# Patient Record
Sex: Male | Born: 1995
Health system: Southern US, Community
[De-identification: ages and names within clinical notes are randomized; demographics above are authoritative.]

## PROBLEM LIST (undated history)

## (undated) DIAGNOSIS — Z7189 Other specified counseling: Secondary | ICD-10-CM

## (undated) HISTORY — PX: FRACTURE SURGERY: SHX138

---
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
## 2017-06-08 ENCOUNTER — Encounter: Payer: Self-pay | Admitting: Family Medicine

## 2017-06-08 ENCOUNTER — Ambulatory Visit (INDEPENDENT_AMBULATORY_CARE_PROVIDER_SITE_OTHER): Payer: BLUE CROSS/BLUE SHIELD | Admitting: Family Medicine

## 2017-06-08 VITALS — BP 126/61 | HR 77 | Temp 98.2°F | Resp 16 | Ht 71.0 in | Wt 267.0 lb

## 2017-06-08 DIAGNOSIS — Z0000 Encounter for general adult medical examination without abnormal findings: Secondary | ICD-10-CM | POA: Diagnosis not present

## 2017-06-08 DIAGNOSIS — L738 Other specified follicular disorders: Secondary | ICD-10-CM

## 2017-06-08 DIAGNOSIS — Z113 Encounter for screening for infections with a predominantly sexual mode of transmission: Secondary | ICD-10-CM | POA: Diagnosis not present

## 2017-06-08 DIAGNOSIS — R011 Cardiac murmur, unspecified: Secondary | ICD-10-CM

## 2017-06-08 DIAGNOSIS — Z8249 Family history of ischemic heart disease and other diseases of the circulatory system: Secondary | ICD-10-CM | POA: Diagnosis not present

## 2017-06-08 MED ORDER — TRIAMCINOLONE ACETONIDE 0.1 % EX CREA: 1.0000 "application " | TOPICAL_CREAM | Freq: Two times a day (BID) | CUTANEOUS | 0 refills | Status: DC

## 2017-06-08 NOTE — Patient Instructions (Addendum)
See information below and try the steroid cream up to twice per day for bumps on scalp. I will refer you to dermatology.   Condoms every time.   I will order echo for your heart murmur. Once that is completed, will likely be able to complete sports paperwork. I will hold onto that paperwork for now.    Keeping you healthy  Get these tests  Blood pressure- Have your blood pressure checked once a year by your healthcare provider.  Normal blood pressure is 120/80.  Weight- Have your body mass index (BMI) calculated to screen for obesity.  BMI is a measure of body fat based on height and weight. You can also calculate your own BMI at [email or web address]-[email or web address]/.  Cholesterol- Have your cholesterol checked regularly starting at age 19, sooner may be necessary if you have diabetes, high blood pressure, if a family member developed heart diseases at an early age or if you smoke.   Chlamydia, HIV, and other sexual transmitted disease- Get screened each year until the age of 19 then within three months of each new sexual partner.  Diabetes- Have your blood sugar checked regularly if you have high blood pressure, high cholesterol, a family history of diabetes or if you are overweight.  Get these vaccines  Flu shot- Every fall.  Tetanus shot- Every 10 years.  Menactra- Single dose; prevents meningitis.  Take these steps  Don't smoke- If you do smoke, ask your healthcare provider about quitting. For tips on how to quit, go to www.smokefree.gov or call 1-800-QUIT-NOW.  Be physically active- Exercise 5 days a week for at least 30 minutes.  If you are not already physically active start slow and gradually work up to 30 minutes of moderate physical activity.  Examples of moderate activity include walking briskly, mowing the yard, dancing, swimming bicycling, etc.  Eat a healthy diet- Eat a variety of healthy foods such as fruits, vegetables, low fat milk, low fat cheese, yogurt, lean meats, poultry,  fish, beans, tofu, etc.  For more information on healthy eating, go to www.thenutritionsource.org  Drink alcohol in moderation- Limit alcohol intake two drinks or less a day.  Never drink and drive.  Dentist- Brush and floss teeth twice daily; visit your dentis twice a year.  Depression-Your emotional health is as important as your physical health.  If you're feeling down, losing interest in things you normally enjoy please talk with your healthcare provider.  Gun Safety- If you keep a gun in your home, keep it unloaded and with the safety lock on.  Bullets should be stored separately.  Helmet use- Always wear a helmet when riding a motorcycle, bicycle, rollerblading or skateboarding.  Safe sex- If you may be exposed to a sexually transmitted infection, use a condom  Seat belts- Seat bels can save your life; always wear one.  Smoke/Carbon Monoxide detectors- These detectors need to be installed on the appropriate level of your home.  Replace batteries at least once a year.  Skin Cancer- When out in the sun, cover up and use sunscreen SPF 15 or higher.  Violence- If anyone is threatening or hurting you, please tell your healthcare provider.  Heart Murmur A heart murmur is an extra sound that is caused by chaotic blood flow. The murmur can be heard as a "hum" or "whoosh" sound when blood flows through the heart. The heart has four areas called chambers. Valves separate the upper and lower chambers from each other (tricuspid valve and mitral valve) and separate the  lower chambers of the heart from pathways that lead away from the heart (aortic valve and pulmonary valve). Normally, the valves open to let blood flow through or out of your heart, and then they shut to keep the blood from flowing backward. There are two types of heart murmurs:  Innocent murmurs. Most people with this type of heart murmur do not have a heart problem. Many children have innocent heart murmurs. Your health care  provider may suggest some basic testing to find out whether your murmur is an innocent murmur. If an innocent heart murmur is found, there is no need for further tests or treatment and no need to restrict activities or stop playing sports.  Abnormal murmurs. These types of murmurs can occur in children and adults. Abnormal murmurs may be a sign of a more serious heart condition, such as a heart defect present at birth (congenital defect) or heart valve disease.  What are the causes? This condition is caused by heart valves that are not working properly. In children, abnormal heart murmurs are typically caused by congenital defects. In adults, abnormal murmurs are usually from heart valve problems caused by disease, infection, or aging. Three types of heart valve defects can cause a murmur:  Regurgitation. This is when blood leaks back through the valve in the wrong direction.  Mitral valve prolapse. This is when the mitral valve of the heart has a loose flap and does not close tightly.  Stenosis. This is when a valve does not open enough and blocks blood flow.  This condition may also be caused by:  Pregnancy.  Fever.  Overactive thyroid gland.  Anemia.  Exercise.  Rapid growth spurts (in children).  What are the signs or symptoms? Innocent murmurs do not cause symptoms, and many people with abnormal murmurs may or may not have symptoms. If symptoms do develop, they may include:  Shortness of breath.  Blue coloring of the skin, especially on the fingertips.  Chest pain.  Palpitations, or feeling a fluttering or skipped heartbeat.  Fainting.  Persistent cough.  Getting tired much faster than expected.  Swelling in the abdomen, feet, or ankles.  How is this diagnosed? This condition may be diagnosed during a routine physical or other exam. If your health care provider hears a murmur with a stethoscope, he or she will listen for:  Where the murmur is located in your  heart.  How long the murmur lasts (duration).  When the murmur is heard during the heartbeat.  How loud the murmur is. This may help the health care provider figure out what is causing the murmur.  You may be referred to a heart specialist (cardiologist). You may also have other tests, including:  Electrocardiogram (ECG or EKG). This test measures the electrical activity of your heart.  Echocardiogram. This test uses high frequency sound waves to make pictures of your heart.  MRI or chest X-ray.  Cardiac catheterization. This test looks at blood flow through the heart.  For children and adults who have an abnormal heart murmur and want to stay active, it is important to complete testing, review test results, and receive recommendations from your health care provider. If heart disease is present, it may not be safe to play or be active. How is this treated? Heart murmurs themselves do not need treatment. In some cases, a heart murmur may go away on its own. If an underlying problem or disease is causing the murmur, you may need treatment. If treatment is needed,  it will depend on the type and severity of the disease or heart problem causing the murmur. Treatment may include:  Medicine.  Surgery.  Dietary and lifestyle changes.  Follow these instructions at home:  Talk with your health care provider before participating in sports or other activities that require a lot of effort and energy (are strenuous).  Learn as much as possible about your condition and any related diseases. Ask your health care provider if you may at risk for any medical emergencies.  Talk with your health care provider about what symptoms you should look out for.  It is up to you to get your test results. Ask your health care provider, or the department that is doing the test, when your results will be ready.  Keep all follow-up visits as told by your health care provider. This is important. Contact a health  care provider if:  You feel light-headed.  You are frequently short of breath.  You feel more tired than usual.  You are having a hard time keeping up with normal activities or fitness routines.  You have swelling in your ankles or feet.  You have chest pain.  You notice that your heart often beats irregularly.  You develop any new symptoms. Get help right away if:  You develop severe chest pain.  You are having trouble breathing.  You have fainting spells.  Your symptoms suddenly get worse. These symptoms may represent a serious problem that is an emergency. Do not wait to see if the symptoms will go away. Get medical help right away. Call your local emergency services (911 in the [place] Do not drive yourself to the hospital. Summary  Normally, the heart valves open to let blood flow through or out of your heart, and then they shut to keep the blood from flowing backward.  Heart murmur is caused by heart valves that are not working properly.  You may need treatment if an underlying problem or disease is causing the heart murmur. Treatment may include medicine, surgery, or dietary and lifestyle changes.  Talk with your health care provider before participating in sports or other activities that require a lot of effort and energy (are strenuous).  Talk with your health care provider about what symptoms you should watch out for. This information is not intended to replace advice given to you by your health care provider. Make sure you discuss any questions you have with your health care provider. Document Released: 12/03/2004 Document Revised: 10/14/2016 Document Reviewed: 10/14/2016 Elsevier Interactive Patient Education  2017 [employer]   Ingrown Hair An ingrown hair is a hair that curls and re-enters the skin instead of growing straight out of the skin. An ingrown hair can develop in any part of the skin that hair is removed from. An ingrown hair may cause small  pockets of infection. What are the causes? An ingrown hair can be caused by:  Shaving.  Tweezing.  Waxing.  Using a hair removal cream.  What increases the risk? Ingrown hairs are more likely to develop in people who have curly hair. What are the signs or symptoms? Symptoms of an ingrown hair may include:  Small bumps on the skin. The bumps may be filled with pus.  Pain.  Itching.  How is this diagnosed? An ingrown hair is diagnosed with a skin exam. How is this treated? Treatment is often not needed unless the ingrown hair has caused an infection. Treatment may involve:  Applying prescription creams to the skin.  This can help with inflammation.  Applying warm compresses to the skin. This can help soften the skin.  Taking antibiotic medicine. An antibiotic may be prescribed if the infection is severe.  Follow these instructions at home:  Do not shave irritated areas of skin. You may start shaving these areas again once the irritation has gone away.  Take, apply, or use over-the-counter and prescription medicines only as told by your health care provider. This includes any prescription creams.  If you were prescribed an antibiotic medicine, take it as told by your health care provider. Do not stop taking the antibiotic even if your condition improves.  To help remove ingrown hairs on your face, you may use a facial sponge in a gentle circular motion.  If directed, apply heat to the affected area. Use the heat source that your health care provider recommends, such as a moist heat pack or a heating pad. ? Place a towel between your skin and the heat source. ? Leave the heat on for 20-30 minutes. ? Remove the heat if your skin turns bright red. This is especially important if you are unable to feel pain, heat, or cold. You may have a greater risk of getting burned. How is this prevented?  Shower before shaving.  Wrap areas that you are going to shave in warm, moist  wraps for several minutes before shaving. The warmth and moisture helps to soften the hairs and makes ingrown hairs less likely.  Use thick shaving gels.  Use a razor that cuts hair slightly above your skin. Or, use an electric shaver with a long shave setting.  Shave in the direction of hair growth.  Avoid making multiple razor strokes.  Apply a moisturizing lotion after shaving. This information is not intended to replace advice given to you by your health care provider. Make sure you discuss any questions you have with your health care provider. Document Released: 02/01/2001 Document Revised: 05/15/2016 Document Reviewed: 08/16/2015 Elsevier Interactive Patient Education  2018 [employer]    IF you received an x-ray today, you will receive an invoice from [facility]. Please contact [facility] at [phone] with questions or concerns regarding your invoice.   IF you received labwork today, you will receive an invoice from [facility]. Please contact [facility] at [phone] with questions or concerns regarding your invoice.   Our billing staff will not be able to assist you with questions regarding bills from these companies.  You will be contacted with the lab results as soon as they are available. The fastest way to get your results is to activate your My Chart account. Instructions are located on the last page of this paperwork. If you have not heard from us regarding the results in 2 weeks, please contact this office.

## 2017-06-08 NOTE — Progress Notes (Addendum)
Subjective:  This chart was scribed for Richard Staggers MD, by [name], at Primary Care at [facility].  This patient was seen in room 10 and the patient's care was started at 10:33 AM.   Chief Complaint  Patient presents with  . Annual Exam     Patient ID: Richard Griffin, male    DOB: 09-Aug-1996, 21 y.o.   MRN: [id number]  HPI HPI Comments: Richard Griffin is a 21 y.o. male who presents to Primary Care at [place] with his mother for an annual exam with a sports physical. He has a history of a concussion in 2014. He had a closed reduction and splinting a left wrist fracture under anesthesia by Dr. Rennis Chris in December 2012.  He had radial and ulnar fractures injured during wrestling at that time. He was seen in 2015 for thoracic back pain but had negative x-rays at that time of thoracic and lumbar spine. --- He has not had any back pains since 2015. He denies any concussions or injuries in the last year.  He is aware of the symptoms of a concussion and has not had one since his last concussion in 2014.  Patient denies any heat strokes or illness. He has not been tested for sickle-cell trait, That he knows of, but no known history of sickle cell trait. Patient denies any heart conditions but did have some chest pains in 2011 after practice.  He had an EKG completed at that time which did not show any concerns. He has not had any chest pains since. His father died from congestive heart failure at the age of 23 and had high blood pressure.  His grandmother also died of a heart condition.   Bumps: Per mom, he has had "out of control bumps" on the back of his head/neck area which has worsened for the past year. They have gotten worse since he has been wearing a football helmet.  He denies any soreness in the area but does have some itching occasionally.  Per mother, he has had these bumps about three years ago but there was very few of them- occurred especially after hair cuts.   Home: Patient lives at  home with his mom and sister but currently lives at school.  He has roommates.    Education:Patient attends [employer].  His GPA is currently a 3.2 and had a 3.5 last semester.  He wants to be a Retail buyer and is also working to have a minor in Actuary.    Activity/sport: D line in football at [employer].  He is currently a sophomore.  He does not hang out much with football players and goes out from time to time with friends. Patient states that there really is not much to do at his school.   Tobacco, alcohol or drug WUJ:WJXBJYN does not smoke tobacco and has smoked weed in the past (a couple times the last semester).  Denies using any supplements which are banned. Patient does not drink alcohol often (once every 2 months).  He does not have any DUI's.  He has not lost consciousness due to alcohol consumption.   Sexual activity:Patient has had 13 partners and has been tested for an STI's about 1 year ago.  He has had 1 new partner since.  He has had unprotected intercourse and would like to be tested for STI's.  Patients mother is aware that he is sexually active.  He has not had an STI in the past.  Immunizations: He has not had an HPV vaccination.   There is no immunization history on file for this patient.   Depression/ suicidal ideation:He was depressed his first semester at school but states that he is doing much better now.  Depression screen PHQ 2/9 06/08/2017  Decreased Interest 0  Down, Depressed, Hopeless 0  PHQ - 2 Score 0   Vision:   Visual Acuity Screening   Right eye Left eye Both eyes  Without correction:     With correction: 20/20 20/20 20/20     There are no active problems to display for this patient.  History reviewed. No pertinent past medical history. Past Surgical History:  Procedure Laterality Date  . CLOSED REDUCTION WRIST FRACTURE  10/26/2011   Procedure: CLOSED REDUCTION WRIST;  Surgeon: Vania Rea Supple;  Location: [facility] OR;   Service: Orthopedics;  Laterality: Left;  closed reduction left distal radius and ulna  . FRACTURE SURGERY     No Known Allergies Prior to Admission medications   Medication Sig Start Date End Date Taking? Authorizing Provider  acetaminophen (TYLENOL) 325 MG tablet Take 2 tablets (650 mg total) by mouth every 6 (six) hours as needed for pain. 08/23/13   Marcellina Millin, MD  cyclobenzaprine (FLEXERIL) 10 MG tablet Take 1 tablet (10 mg total) by mouth 3 (three) times daily as needed for muscle spasms. 06/04/14   Carmelina Dane, MD   Social History   Social History  . Marital status: Single    Spouse name: N/A  . Number of children: N/A  . Years of education: N/A   Occupational History  . Not on file.   Social History Main Topics  . Smoking status: Never Smoker  . Smokeless tobacco: Never Used  . Alcohol use No  . Drug use: Unknown  . Sexual activity: Yes   Other Topics Concern  . Not on file   Social History Narrative  . No narrative on file      Review of Systems  Constitutional: Negative for chills and fever.  Eyes: Negative for pain and redness.  Respiratory: Negative for cough and shortness of breath.   Gastrointestinal: Negative for nausea and vomiting.  Genitourinary: Negative for penile pain.  Musculoskeletal: Negative for neck pain and neck stiffness.  Skin:       bumps on the back of head  Neurological: Negative for seizures and speech difficulty.       Objective:   Physical Exam  Constitutional: He is oriented to person, place, and time. He appears well-developed and well-nourished.  HENT:  Head: Normocephalic and atraumatic.  Right Ear: External ear normal.  Left Ear: External ear normal.  Mouth/Throat: Oropharynx is clear and moist.  Eyes: Pupils are equal, round, and reactive to light. Conjunctivae and EOM are normal.  Neck: Normal range of motion. Neck supple. No thyromegaly present.  Cardiovascular: Normal rate, regular rhythm and intact  distal pulses.   Murmur heard. Pulmonary/Chest: Effort normal and breath sounds normal. No respiratory distress. He has no wheezes.  Grade 1 to possibly 2 systolic murmur LUSB, no change with valsalva no apparent change with squatting, slightly more prominent with standing.   Abdominal: Soft. He exhibits no distension. There is no tenderness.  Musculoskeletal: Normal range of motion. He exhibits no edema or tenderness.       Right shoulder: Normal.       Left shoulder: Normal.       Right elbow: Normal.      Left elbow: Normal.  Right wrist: Normal.       Left wrist: Normal.       Right hip: Normal.       Left hip: Normal.       Right knee: Normal.       Left knee: Normal.       Right ankle: Normal.       Left ankle: Normal.       Lumbar back: Normal.  Lymphadenopathy:    He has no cervical adenopathy.  Neurological: He is alert and oriented to person, place, and time. He has normal reflexes.  Skin: Skin is warm and dry.  Some coalescent hyperpigmented nodular areas on the posterior scalp, upper neck, no surrounding erythema of fluctuance.   Psychiatric: He has a normal mood and affect. His behavior is normal.  Vitals reviewed.   Vitals:   06/08/17 0955  BP: 126/61  Pulse: 77  Resp: 16  Temp: 98.2 F (36.8 C)  TempSrc: Oral  SpO2: 96%  Weight: 267 lb (121.1 kg)  Height: 5\' 11"  (1.803 m)   EKG: sinus rhythm,  non specific st depression/t wave inversion lead III. ST elevation likely early repol V2-V3, no prior ekg for comparison.     Assessment & Plan:    Redge GainerQuran Younts is a 21 y.o. male Annual physical exam  - Annual exam/cpe with sports physical completed.  Will defer completion of sports physical until murmur evaluated with echocardiogram as below. No other concerning findings on exam once that has cleared.  - Age appropriate health guidance given.  Family history of heart disease - Plan: ECHOCARDIOGRAM COMPLETE Heart murmur - Plan: EKG 12-Lead, ECHOCARDIOGRAM  COMPLETE  -Early family history of cardiac death, reported CHF in his father at 19 years of age.  -I do appreciate a possible slight murmur on his exam and office. Denies chest pain, dyspnea on exertion, but was seen for chest pain in 2011. Reportedly EKG was normal at that time, but I do not have access to that at this visit. Current EKG with some irregularities as above, and with family history of early cardiac death, and slight murmur, will obtain echocardiogram prior to clearing for sports to rule out hypertrophic cardiomyopathy.   Folliculitis barbae - Plan: Ambulatory referral to Dermatology, triamcinolone cream (KENALOG) 0.1 %  -Appears to be folliculus [place], likely related to irritation from helmet. Trial of triamcinolone 0.1% twice a day to 3 times a day when necessary, follow-up with dermatology.  Routine screening for STI (sexually transmitted infection) - Plan: HIV antibody, RPR, GC/Chlamydia Probe Amp  -Testing as above, safer sex practices discussed.   Addendum 06/11/17:  Echocardiogram obtained yesterday without concerning findings. Results as below. [facility] and advise him to pick that paperwork up today.  Study Conclusions  - Left ventricle: The cavity size was normal. Systolic function was   normal. The estimated ejection fraction was in the range of 60%   to 65%. Wall motion was normal; there were no regional wall   motion abnormalities. - Atrial septum: No defect or patent foramen ovale was identified.  Meds ordered this encounter  Medications  . triamcinolone cream (KENALOG) 0.1 %    Sig: Apply 1 application topically 2 (two) times daily.    Dispense:  30 g    Refill:  0   Patient Instructions   See information below and try the steroid cream up to twice per day for bumps on scalp. I will  refer you to dermatology.   Condoms every time.   I will order echo for your heart murmur. Once that is  completed, will likely be able to complete sports paperwork. I will hold onto that paperwork for now.    Keeping you healthy  Get these tests  Blood pressure- Have your blood pressure checked once a year by your healthcare provider.  Normal blood pressure is 120/80.  Weight- Have your body mass index (BMI) calculated to screen for obesity.  BMI is a measure of body fat based on height and weight. You can also calculate your own BMI at https://www.west-esparza.com/www.nhlbisupport.com/bmi/.  Cholesterol- Have your cholesterol checked regularly starting at age 19, sooner may be necessary if you have diabetes, high blood pressure, if a family member developed heart diseases at an early age or if you smoke.   Chlamydia, HIV, and other sexual transmitted disease- Get screened each year until the age of 19 then within three months of each new sexual partner.  Diabetes- Have your blood sugar checked regularly if you have high blood pressure, high cholesterol, a family history of diabetes or if you are overweight.  Get these vaccines  Flu shot- Every fall.  Tetanus shot- Every 10 years.  Menactra- Single dose; prevents meningitis.  Take these steps  Don't smoke- If you do smoke, ask your healthcare provider about quitting. For tips on how to quit, go to www.smokefree.gov or call 1-800-QUIT-NOW.  Be physically active- Exercise 5 days a week for at least 30 minutes.  If you are not already physically active start slow and gradually work up to 30 minutes of moderate physical activity.  Examples of moderate activity include walking briskly, mowing the yard, dancing, swimming bicycling, etc.  Eat a healthy diet- Eat a variety of healthy foods such as fruits, vegetables, low fat milk, low fat cheese, yogurt, lean meats, poultry, fish, beans, tofu, etc.  For more information on healthy eating, go to www.thenutritionsource.org  Drink alcohol in moderation- Limit alcohol intake two drinks or less a day.  Never drink and  drive.  Dentist- Brush and floss teeth twice daily; visit your dentis twice a year.  Depression-Your emotional health is as important as your physical health.  If you're feeling down, losing interest in things you normally enjoy please talk with your healthcare provider.  Gun Safety- If you keep a gun in your home, keep it unloaded and with the safety lock on.  Bullets should be stored separately.  Helmet use- Always wear a helmet when riding a motorcycle, bicycle, rollerblading or skateboarding.  Safe sex- If you may be exposed to a sexually transmitted infection, use a condom  Seat belts- Seat bels can save your life; always wear one.  Smoke/Carbon Monoxide detectors- These detectors need to be installed on the appropriate level of your home.  Replace batteries at least once a year.  Skin Cancer- When out in the sun, cover up and use sunscreen SPF 15 or higher.  Violence- If anyone is threatening or hurting you, please tell your healthcare provider.  Heart Murmur A heart murmur is an extra sound that is caused by chaotic blood flow. The murmur can be heard as a "hum" or "whoosh" sound when blood flows through the heart. The heart has four areas called chambers. Valves separate the upper and lower chambers from each other (tricuspid valve and mitral valve) and separate the lower chambers of the heart from pathways that lead away from the heart (aortic valve and pulmonary valve).  Normally, the valves open to let blood flow through or out of your heart, and then they shut to keep the blood from flowing backward. There are two types of heart murmurs:  Innocent murmurs. Most people with this type of heart murmur do not have a heart problem. Many children have innocent heart murmurs. Your health care provider may suggest some basic testing to find out whether your murmur is an innocent murmur. If an innocent heart murmur is found, there is no need for further tests or treatment and no need to  restrict activities or stop playing sports.  Abnormal murmurs. These types of murmurs can occur in children and adults. Abnormal murmurs may be a sign of a more serious heart condition, such as a heart defect present at birth (congenital defect) or heart valve disease.  What are the causes? This condition is caused by heart valves that are not working properly. In children, abnormal heart murmurs are typically caused by congenital defects. In adults, abnormal murmurs are usually from heart valve problems caused by disease, infection, or aging. Three types of heart valve defects can cause a murmur:  Regurgitation. This is when blood leaks back through the valve in the wrong direction.  Mitral valve prolapse. This is when the mitral valve of the heart has a loose flap and does not close tightly.  Stenosis. This is when a valve does not open enough and blocks blood flow.  This condition may also be caused by:  Pregnancy.  Fever.  Overactive thyroid gland.  Anemia.  Exercise.  Rapid growth spurts (in children).  What are the signs or symptoms? Innocent murmurs do not cause symptoms, and many people with abnormal murmurs may or may not have symptoms. If symptoms do develop, they may include:  Shortness of breath.  Blue coloring of the skin, especially on the fingertips.  Chest pain.  Palpitations, or feeling a fluttering or skipped heartbeat.  Fainting.  Persistent cough.  Getting tired much faster than expected.  Swelling in the abdomen, feet, or ankles.  How is this diagnosed? This condition may be diagnosed during a routine physical or other exam. If your health care provider hears a murmur with a stethoscope, he or she will listen for:  Where the murmur is located in your heart.  How long the murmur lasts (duration).  When the murmur is heard during the heartbeat.  How loud the murmur is. This may help the health care provider figure out what is causing the  murmur.  You may be referred to a heart specialist (cardiologist). You may also have other tests, including:  Electrocardiogram (ECG or EKG). This test measures the electrical activity of your heart.  Echocardiogram. This test uses high frequency sound waves to make pictures of your heart.  MRI or chest X-ray.  Cardiac catheterization. This test looks at blood flow through the heart.  For children and adults who have an abnormal heart murmur and want to stay active, it is important to complete testing, review test results, and receive recommendations from your health care provider. If heart disease is present, it may not be safe to play or be active. How is this treated? Heart murmurs themselves do not need treatment. In some cases, a heart murmur may go away on its own. If an underlying problem or disease is causing the murmur, you may need treatment. If treatment is needed, it will depend on the type and severity of the disease or heart problem causing the murmur. Treatment  may include:  Medicine.  Surgery.  Dietary and lifestyle changes.  Follow these instructions at home:  Talk with your health care provider before participating in sports or other activities that require a lot of effort and energy (are strenuous).  Learn as much as possible about your condition and any related diseases. Ask your health care provider if you may at risk for any medical emergencies.  Talk with your health care provider about what symptoms you should look out for.  It is up to you to get your test results. Ask your health care provider, or the department that is doing the test, when your results will be ready.  Keep all follow-up visits as told by your health care provider. This is important. Contact a health care provider if:  You feel light-headed.  You are frequently short of breath.  You feel more tired than usual.  You are having a hard time keeping up with normal activities or fitness  routines.  You have swelling in your ankles or feet.  You have chest pain.  You notice that your heart often beats irregularly.  You develop any new symptoms. Get help right away if:  You develop severe chest pain.  You are having trouble breathing.  You have fainting spells.  Your symptoms suddenly get worse. These symptoms may represent a serious problem that is an emergency. Do not wait to see if the symptoms will go away. Get medical help right away. Call your local emergency services (911 in the [place] Do not drive yourself to the hospital. Summary  Normally, the heart valves open to let blood flow through or out of your heart, and then they shut to keep the blood from flowing backward.  Heart murmur is caused by heart valves that are not working properly.  You may need treatment if an underlying problem or disease is causing the heart murmur. Treatment may include medicine, surgery, or dietary and lifestyle changes.  Talk with your health care provider before participating in sports or other activities that require a lot of effort and energy (are strenuous).  Talk with your health care provider about what symptoms you should watch out for. This information is not intended to replace advice given to you by your health care provider. Make sure you discuss any questions you have with your health care provider. Document Released: 12/03/2004 Document Revised: 10/14/2016 Document Reviewed: 10/14/2016 Elsevier Interactive Patient Education  2017 Elsevier Inc.   Ingrown Hair An ingrown hair is a hair that curls and re-enters the skin instead of growing straight out of the skin. An ingrown hair can develop in any part of the skin that hair is removed from. An ingrown hair may cause small pockets of infection. What are the causes? An ingrown hair can be caused by:  Shaving.  Tweezing.  Waxing.  Using a hair removal cream.  What increases the risk? Ingrown hairs are more  likely to develop in people who have curly hair. What are the signs or symptoms? Symptoms of an ingrown hair may include:  Small bumps on the skin. The bumps may be filled with pus.  Pain.  Itching.  How is this diagnosed? An ingrown hair is diagnosed with a skin exam. How is this treated? Treatment is often not needed unless the ingrown hair has caused an infection. Treatment may involve:  Applying prescription creams to the skin. This can help with inflammation.  Applying warm compresses to the skin. This can help soften the skin.  Taking antibiotic medicine. An antibiotic may be prescribed if the infection is severe.  Follow these instructions at home:  Do not shave irritated areas of skin. You may start shaving these areas again once the irritation has gone away.  Take, apply, or use over-the-counter and prescription medicines only as told by your health care provider. This includes any prescription creams.  If you were prescribed an antibiotic medicine, take it as told by your health care provider. Do not stop taking the antibiotic even if your condition improves.  To help remove ingrown hairs on your face, you may use a facial sponge in a gentle circular motion.  If directed, apply heat to the affected area. Use the heat source that your health care provider recommends, such as a moist heat pack or a heating pad. ? Place a towel between your skin and the heat source. ? Leave the heat on for 20-30 minutes. ? Remove the heat if your skin turns bright red. This is especially important if you are unable to feel pain, heat, or cold. You may have a greater risk of getting burned. How is this prevented?  Shower before shaving.  Wrap areas that you are going to shave in warm, moist wraps for several minutes before shaving. The warmth and moisture helps to soften the hairs and makes ingrown hairs less likely.  Use thick shaving gels.  Use a razor that cuts hair slightly above  your skin. Or, use an electric shaver with a long shave setting.  Shave in the direction of hair growth.  Avoid making multiple razor strokes.  Apply a moisturizing lotion after shaving. This information is not intended to replace advice given to you by your health care provider. Make sure you discuss any questions you have with your health care provider. Document Released: 02/01/2001 Document Revised: 05/15/2016 Document Reviewed: 08/16/2015 Elsevier Interactive Patient Education  2018 [employer].    IF you received an x-ray today, you will receive an invoice from [facility]. Please contact [facility] at [phone] with questions or concerns regarding your invoice.   IF you received labwork today, you will receive an invoice from [facility]. Please contact [facility] at [phone] with questions or concerns regarding your invoice.   Our billing staff will not be able to assist you with questions regarding bills from these companies.  You will be contacted with the lab results as soon as they are available. The fastest way to get your results is to activate your My Chart account. Instructions are located on the last page of this paperwork. If you have not heard from [place] regarding the results in 2 weeks, please contact this office.       I personally performed the services described in this documentation, which was scribed in my presence. The recorded information has been reviewed and considered for accuracy and completeness, addended by me as needed, and agree with information above.  Signed,   Richard Staggers, MD Primary Care at [facility].  06/09/17 4:45 PM

## 2017-06-09 LAB — GC/CHLAMYDIA PROBE AMP
Chlamydia trachomatis, NAA: NEGATIVE
Neisseria gonorrhoeae by PCR: NEGATIVE

## 2017-06-09 LAB — RPR: RPR Ser Ql: NONREACTIVE

## 2017-06-09 LAB — HIV ANTIBODY (ROUTINE TESTING W REFLEX): HIV Screen 4th Generation wRfx: NONREACTIVE

## 2017-06-10 ENCOUNTER — Telehealth: Payer: Self-pay | Admitting: Family Medicine

## 2017-06-10 ENCOUNTER — Ambulatory Visit (HOSPITAL_COMMUNITY): Payer: BLUE CROSS/BLUE SHIELD | Attending: Cardiovascular Disease

## 2017-06-10 ENCOUNTER — Other Ambulatory Visit: Payer: Self-pay

## 2017-06-10 DIAGNOSIS — R011 Cardiac murmur, unspecified: Secondary | ICD-10-CM | POA: Insufficient documentation

## 2017-06-10 DIAGNOSIS — Z8249 Family history of ischemic heart disease and other diseases of the circulatory system: Secondary | ICD-10-CM | POA: Diagnosis not present

## 2017-06-10 NOTE — Telephone Encounter (Signed)
DR Neva SeatGREENE PT CALLING TO LET US KNOW WHEN DR GREENE CAN FINISH UP HIS SPORTS PHYSICAL HE HAD WENT TO GET THE ECHO FOR HIS HEART MURMUR PLEASE RESPOND

## 2017-06-10 NOTE — Telephone Encounter (Signed)
Called and spoke with patient. Echo was completed today. Transferred patient to scheduling to set up appointment with Dr. Neva SeatGreene for physical completion.

## 2017-06-11 ENCOUNTER — Ambulatory Visit: Payer: BLUE CROSS/BLUE SHIELD | Admitting: Family Medicine

## 2017-06-11 ENCOUNTER — Telehealth: Payer: Self-pay

## 2017-06-11 NOTE — Telephone Encounter (Signed)
Spoke with Pts mom and gave her results of echocardiogram per release of information and advised that paperwork was at the [address].

## 2017-06-15 ENCOUNTER — Encounter: Payer: Self-pay | Admitting: *Deleted

## 2017-06-15 NOTE — Progress Notes (Signed)
Informed all lab results are normal

## 2018-04-05 ENCOUNTER — Ambulatory Visit (INDEPENDENT_AMBULATORY_CARE_PROVIDER_SITE_OTHER): Payer: BLUE CROSS/BLUE SHIELD | Admitting: Physician Assistant

## 2018-04-05 ENCOUNTER — Encounter: Payer: Self-pay | Admitting: Physician Assistant

## 2018-04-05 VITALS — BP 122/72 | HR 84 | Temp 98.4°F | Resp 17 | Ht 72.0 in | Wt 271.0 lb

## 2018-04-05 DIAGNOSIS — R300 Dysuria: Secondary | ICD-10-CM | POA: Diagnosis not present

## 2018-04-05 LAB — POCT URINALYSIS DIP (MANUAL ENTRY)
BILIRUBIN UA: NEGATIVE
BILIRUBIN UA: NEGATIVE mg/dL
Blood, UA: NEGATIVE
Glucose, UA: NEGATIVE mg/dL
Nitrite, UA: NEGATIVE
PH UA: 6 (ref 5.0–8.0)
Protein Ur, POC: NEGATIVE mg/dL
SPEC GRAV UA: 1.02 (ref 1.010–1.025)
Urobilinogen, UA: 0.2 E.U./dL

## 2018-04-05 LAB — POC MICROSCOPIC URINALYSIS (UMFC): MUCUS RE: ABSENT

## 2018-04-05 MED ORDER — CEFTRIAXONE SODIUM 250 MG IJ SOLR
250.0000 mg | Freq: Once | INTRAMUSCULAR | Status: AC
  Administered 2018-04-05: 250 mg via INTRAMUSCULAR

## 2018-04-05 MED ORDER — AZITHROMYCIN 250 MG PO TABS: ORAL_TABLET | ORAL | 0 refills | Status: DC

## 2018-04-05 NOTE — Progress Notes (Signed)
04/05/2018 5:26 PM   DOB: August 21, 1996 / MRN: [id number]  SUBJECTIVE:  Richard Griffin is a 22 y.o. male presenting for dysuria.  This started 4 to 5 days ago and he feels it is getting worse.  He had unprotected sex with a male partner and tells me this is a casual relationship.  He denies testicular pain, pain with defecation, perineal pain, fever, chills, flank pain.  He has No Known Allergies.   He  has no past medical history on file.    He  reports that he has never smoked. He has never used smokeless tobacco. He reports that he has current or past drug history. Drug: Marijuana. He reports that he does not drink alcohol. He  reports that he currently engages in sexual activity. The patient  has a past surgical history that includes Closed reduction wrist fracture (10/26/2011) and Fracture surgery.  His family history includes Diabetes in his maternal grandfather; Heart disease in his sister; Hyperlipidemia in his maternal grandfather and sister; Hypertension in his brother, maternal grandfather, and sister; Mental illness in his brother; Mental retardation in his maternal grandfather and maternal grandmother.  ROS per HPI  The problem list and medications were reviewed and updated by myself where necessary and exist elsewhere in the encounter.   OBJECTIVE:  BP 122/72   Pulse 84   Temp 98.4 F (36.9 C) (Oral)   Resp 17   Ht 6' (1.829 m)   Wt 271 lb (122.9 kg)   SpO2 98%   BMI 36.75 kg/m   Physical Exam  Constitutional: He is oriented to person, place, and time. He appears well-developed. He does not appear ill.  Eyes: Pupils are equal, round, and reactive to light. Conjunctivae and EOM are normal.  Cardiovascular: Normal rate.  Pulmonary/Chest: Effort normal.  Abdominal: He exhibits no distension. Hernia confirmed negative in the right inguinal area and confirmed negative in the left inguinal area.  Genitourinary: Testes normal and penis normal.     Musculoskeletal:  Normal range of motion.  Lymphadenopathy: No inguinal adenopathy noted on the right or left side.  Neurological: He is alert and oriented to person, place, and time. No cranial nerve deficit. Coordination normal.  Skin: Skin is warm and dry. He is not diaphoretic.  Psychiatric: He has a normal mood and affect.  Nursing note and vitals reviewed.   Results for orders placed or performed in visit on 04/05/18 (from the past 72 hour(s))  POCT urinalysis dipstick     Status: Abnormal   Collection Time: 04/05/18  5:19 PM  Result Value Ref Range   Color, UA yellow yellow   Clarity, UA clear clear   Glucose, UA negative negative mg/dL   Bilirubin, UA negative negative   Ketones, POC UA negative negative mg/dL   Spec Grav, UA 4.098 1.191 - 1.025   Blood, UA negative negative   pH, UA 6.0 5.0 - 8.0   Protein Ur, POC negative negative mg/dL   Urobilinogen, UA 0.2 0.2 or 1.0 E.U./dL   Nitrite, UA Negative Negative   Leukocytes, UA Trace (A) Negative  POCT Microscopic Urinalysis ([facility])     Status: Abnormal   Collection Time: 04/05/18  5:21 PM  Result Value Ref Range   WBC,UR,HPF,POC Moderate (A) None WBC/hpf   RBC,UR,HPF,POC Few (A) None RBC/hpf   Bacteria None None, Too numerous to count   Mucus Absent Absent   Epithelial Cells, UR Per Microscopy None None, Too numerous to count cells/hpf  No results found.  ASSESSMENT AND PLAN:  Richard Griffin was seen today for groin pain.  Diagnoses and all orders for this visit:  Dysuria: Patient here with 3 to 4 days of dysuria.  Last sexual exposure 2 weeks ago. he did not use protection.  This is most likely chlamydia given the appearance of the mild discharge.  I am treating him for that with azithromycin and have advised that he come back in about 6 weeks for HIV and syphilis testing.  Advised he wear condoms in the future.  Culture out in the event that this is a UTI. -     POCT urinalysis dipstick -     POCT Microscopic Urinalysis ([facility]) -      GC/Chlamydia Probe Amp -     Trichomonas vaginalis, RNA -     cefTRIAXone (ROCEPHIN) injection 250 mg -     azithromycin (ZITHROMAX) 250 MG tablet; Take 4 tabs once with a small snack. -     Urine culture    The patient is advised to call or return to clinic if he does not see an improvement in symptoms, or to seek the care of the closest emergency department if he worsens with the above plan.   Deliah Boston, MHS, PA-C Primary Care at [facility] 04/05/2018 5:26 PM

## 2018-04-05 NOTE — Patient Instructions (Addendum)
  Go to the pharmacy and get the four pills and take them at one time with a little snack.  I will release you mychart results in the next few days.    IF you received an x-ray today, you will receive an invoice from [facility]. Please contact [facility] at [phone] with questions or concerns regarding your invoice.   IF you received labwork today, you will receive an invoice from [facility]. Please contact [facility] at [phone] with questions or concerns regarding your invoice.   Our billing staff will not be able to assist you with questions regarding bills from these companies.  You will be contacted with the lab results as soon as they are available. The fastest way to get your results is to activate your My Chart account. Instructions are located on the last page of this paperwork. If you have not heard from [place] regarding the results in 2 weeks, please contact this office.

## 2018-04-06 LAB — URINE CULTURE: Organism ID, Bacteria: NO GROWTH

## 2018-04-07 LAB — GC/CHLAMYDIA PROBE AMP
Chlamydia trachomatis, NAA: POSITIVE — AB
Neisseria gonorrhoeae by PCR: NEGATIVE

## 2018-04-07 LAB — TRICHOMONAS VAGINALIS, PROBE AMP: Trich vag by NAA: NEGATIVE

## 2018-05-18 ENCOUNTER — Ambulatory Visit: Payer: BLUE CROSS/BLUE SHIELD | Admitting: Physician Assistant

## 2019-01-03 ENCOUNTER — Other Ambulatory Visit: Payer: Self-pay | Admitting: Family Medicine

## 2019-01-03 DIAGNOSIS — L738 Other specified follicular disorders: Secondary | ICD-10-CM

## 2019-03-01 ENCOUNTER — Other Ambulatory Visit: Payer: Self-pay | Admitting: Family Medicine

## 2019-03-01 DIAGNOSIS — L738 Other specified follicular disorders: Secondary | ICD-10-CM

## 2019-03-01 NOTE — Telephone Encounter (Signed)
Requested Prescriptions  Pending Prescriptions Disp Refills  . triamcinolone cream (KENALOG) 0.1 % [Pharmacy Med Name: TRIAMCINOLONE 0.1% CREAM] 30 g 0    Sig: APPLY TO AFFECTED AREA TWICE A DAY     Dermatology:  Corticosteroids Passed - 03/01/2019  1:13 PM      Passed - Valid encounter within last 12 months    Recent Outpatient Visits          11 months ago Dysuria   [facility], Marolyn Hammock, PA-C   1 year ago Annual physical exam   [facility], Asencion Partridge, MD   4 years ago Midline thoracic back pain   [facility], Tessa Lerner, MD

## 2019-07-31 ENCOUNTER — Telehealth: Payer: Self-pay

## 2019-07-31 NOTE — Telephone Encounter (Signed)
LVM for mom to let her know that her insurance is [employer] and that we are out of network. And to see if she wanted to keep appt for son with Jens Som.

## 2019-08-01 ENCOUNTER — Encounter: Payer: Self-pay | Admitting: Adult Health Nurse Practitioner

## 2019-08-01 ENCOUNTER — Telehealth (INDEPENDENT_AMBULATORY_CARE_PROVIDER_SITE_OTHER): Payer: BLUE CROSS/BLUE SHIELD | Admitting: Adult Health Nurse Practitioner

## 2019-08-01 VITALS — Ht 72.0 in | Wt 305.0 lb

## 2019-08-01 DIAGNOSIS — Z7189 Other specified counseling: Secondary | ICD-10-CM | POA: Diagnosis not present

## 2019-08-01 HISTORY — DX: Other specified counseling: Z71.89

## 2019-08-01 NOTE — Patient Instructions (Signed)
COVID-19 Frequently Asked Questions °COVID-19 (coronavirus disease) is an infection that is caused by a large family of viruses. Some viruses cause illness in people and others cause illness in animals like camels, cats, and bats. In some cases, the viruses that cause illness in animals can spread to humans. °Where did the coronavirus come from? °In December 2019, [place] told the World Health Organization (WHO) of several cases of lung disease (human respiratory illness). These cases were linked to an open seafood and livestock market in the city of [place]. The link to the seafood and livestock market suggests that the virus may have spread from animals to humans. However, since that first outbreak in December, the virus has also been shown to spread from person to person. °What is the name of the disease and the virus? °Disease name °Early on, this disease was called novel coronavirus. This is because scientists determined that the disease was caused by a new (novel) respiratory virus. The World Health Organization (WHO) has now named the disease COVID-19, or coronavirus disease. °Virus name °The virus that causes the disease is called severe acute respiratory syndrome coronavirus 2 (SARS-CoV-2). °More information on disease and virus naming °World Health Organization (WHO): www.who.int/emergencies/diseases/novel-coronavirus-2019/technical-guidance/naming-the-coronavirus-disease-(covid-2019)-and-the-virus-that-causes-it °Who is at risk for complications from coronavirus disease? °Some people may be at higher risk for complications from coronavirus disease. This includes older adults and people who have chronic diseases, such as heart disease, diabetes, and lung disease. °If you are at higher risk for complications, take these extra precautions: °· Avoid close contact with people who are sick or have a fever or cough. Stay at least 3-6 ft (1-2 m) away from them, if possible. °· Wash your hands often with soap and  water for at least 20 seconds. °· Avoid touching your face, mouth, nose, or eyes. °· Keep supplies on hand at home, such as food, medicine, and cleaning supplies. °· Stay home as much as possible. °· Avoid social gatherings and travel. °How does coronavirus disease spread? °The virus that causes coronavirus disease spreads easily from person to person (is contagious). There are also cases of community-spread disease. This means the disease has spread to: °· People who have no known contact with other infected people. °· People who have not traveled to areas where there are known cases. °It appears to spread from one person to another through droplets from coughing or sneezing. °Can I get the virus from touching surfaces or objects? °There is still a lot that we do not know about the virus that causes coronavirus disease. Scientists are basing a lot of information on what they know about similar viruses, such as: °· Viruses cannot generally survive on surfaces for long. They need a human body (host) to survive. °· It is more likely that the virus is spread by close contact with people who are sick (direct contact), such as through: °? Shaking hands or hugging. °? Breathing in respiratory droplets that travel through the air. This can happen when an infected person coughs or sneezes on or near other people. °· It is less likely that the virus is spread when a person touches a surface or object that has the virus on it (indirect contact). The virus may be able to enter the body if the person touches a surface or object and then touches his or her face, eyes, nose, or mouth. °Can a person spread the virus without having symptoms of the disease? °It may be possible for the virus to spread before a person   has symptoms of the disease, but this is most likely not the main way the virus is spreading. It is more likely for the virus to spread by being in close contact with people who are sick and breathing in the respiratory  droplets of a sick person's cough or sneeze. °What are the symptoms of coronavirus disease? °Symptoms vary from person to person and can range from mild to severe. Symptoms may include: °· Fever. °· Cough. °· Tiredness, weakness, or fatigue. °· Fast breathing or feeling short of breath. °These symptoms can appear anywhere from 2 to 14 days after you have been exposed to the virus. If you develop symptoms, call your health care provider. People with severe symptoms may need hospital care. °If I am exposed to the virus, how long does it take before symptoms start? °Symptoms of coronavirus disease may appear anywhere from 2 to 14 days after a person has been exposed to the virus. If you develop symptoms, call your health care provider. °Should I be tested for this virus? °Your health care provider will decide whether to test you based on your symptoms, history of exposure, and your risk factors. °How does a health care provider test for this virus? °Health care providers will collect samples to send for testing. Samples may include: °· Taking a swab of fluid from the nose. °· Taking fluid from the lungs by having you cough up mucus (sputum) into a sterile cup. °· Taking a blood sample. °· Taking a stool or urine sample. °Is there a treatment or vaccine for this virus? °Currently, there is no vaccine to prevent coronavirus disease. Also, there are no medicines like antibiotics or antivirals to treat the virus. A person who becomes sick is given supportive care, which means rest and fluids. A person may also relieve his or her symptoms by using over-the-counter medicines that treat sneezing, coughing, and runny nose. These are the same medicines that a person takes for the common cold. °If you develop symptoms, call your health care provider. People with severe symptoms may need hospital care. °What can I do to protect myself and my family from this virus? ° °  ° °You can protect yourself and your family by taking the  same actions that you would take to prevent the spread of other viruses. Take the following actions: °· Wash your hands often with soap and water for at least 20 seconds. If soap and water are not available, use alcohol-based hand sanitizer. °· Avoid touching your face, mouth, nose, or eyes. °· Cough or sneeze into a tissue, sleeve, or elbow. Do not cough or sneeze into your hand or the air. °? If you cough or sneeze into a tissue, throw it away immediately and wash your hands. °· Disinfect objects and surfaces that you frequently touch every day. °· Avoid close contact with people who are sick or have a fever or cough. Stay at least 3-6 ft (1-2 m) away from them, if possible. °· Stay home if you are sick, except to get medical care. Call your health care provider before you get medical care. °· Make sure your vaccines are up to date. Ask your health care provider what vaccines you need. °What should I do if I need to travel? °Follow travel recommendations from your local health authority, the CDC, and WHO. °Travel information and advice °· Centers for Disease Control and Prevention (CDC): www.cdc.gov/coronavirus/2019-ncov/travelers/index.html °· World Health Organization (WHO): www.who.int/emergencies/diseases/novel-coronavirus-2019/travel-advice °Know the risks and take action to protect your health °·   You are at higher risk of getting coronavirus disease if you are traveling to areas with an outbreak or if you are exposed to travelers from areas with an outbreak. °· Wash your hands often and practice good hygiene to lower the risk of catching or spreading the virus. °What should I do if I am sick? °General instructions to stop the spread of infection °· Wash your hands often with soap and water for at least 20 seconds. If soap and water are not available, use alcohol-based hand sanitizer. °· Cough or sneeze into a tissue, sleeve, or elbow. Do not cough or sneeze into your hand or the air. °· If you cough or  sneeze into a tissue, throw it away immediately and wash your hands. °· Stay home unless you must get medical care. Call your health care provider or local health authority before you get medical care. °· Avoid public areas. Do not take public transportation, if possible. °· If you can, wear a mask if you must go out of the house or if you are in close contact with someone who is not sick. °Keep your home clean °· Disinfect objects and surfaces that are frequently touched every day. This may include: °? Counters and tables. °? Doorknobs and light switches. °? Sinks and faucets. °? Electronics such as phones, remote controls, keyboards, computers, and tablets. °· Wash dishes in hot, soapy water or use a dishwasher. Air-dry your dishes. °· Wash laundry in hot water. °Prevent infecting other household members °· Let healthy household members care for children and pets, if possible. If you have to care for children or pets, wash your hands often and wear a mask. °· Sleep in a different bedroom or bed, if possible. °· Do not share personal items, such as razors, toothbrushes, deodorant, combs, brushes, towels, and washcloths. °Where to find more information °Centers for Disease Control and Prevention (CDC) °· Information and news updates: www.cdc.gov/coronavirus/2019-ncov °World Health Organization (WHO) °· Information and news updates: www.who.int/emergencies/diseases/novel-coronavirus-2019 °· Coronavirus health topic: www.who.int/health-topics/coronavirus °· Questions and answers on COVID-19: www.who.int/news-room/q-a-detail/q-a-coronaviruses °· Global tracker: who.sprinklr.com °American Academy of Pediatrics (AAP) °· Information for families: www.healthychildren.org/English/health-issues/conditions/chest-lungs/Pages/2019-Novel-Coronavirus.aspx °The coronavirus situation is changing rapidly. Check your local health authority website or the CDC and WHO websites for updates and news. °When should I contact a health care  provider? °· Contact your health care provider if you have symptoms of an infection, such as fever or cough, and you: °? Have been near anyone who is known to have coronavirus disease. °? Have come into contact with a person who is suspected to have coronavirus disease. °? Have traveled outside of the country. °When should I get emergency medical care? °· Get help right away by calling your local emergency services (911 in the [place] if you have: °? Trouble breathing. °? Pain or pressure in your chest. °? Confusion. °? Blue-tinged lips and fingernails. °? Difficulty waking from sleep. °? Symptoms that get worse. °Let the emergency medical personnel know if you think you have coronavirus disease. °Summary °· A new respiratory virus is spreading from person to person and causing COVID-19 (coronavirus disease). °· The virus that causes COVID-19 appears to spread easily. It spreads from one person to another through droplets from coughing or sneezing. °· Older adults and those with chronic diseases are at higher risk of disease. If you are at higher risk for complications, take extra precautions. °· There is currently no vaccine to prevent coronavirus disease. There are no medicines, such as antibiotics or   antivirals, to treat the virus. °· You can protect yourself and your family by washing your hands often, avoiding touching your face, and covering your coughs and sneezes. °This information is not intended to replace advice given to you by your health care provider. Make sure you discuss any questions you have with your health care provider. °Document Released: 02/21/2019 Document Revised: 02/21/2019 Document Reviewed: 02/21/2019 °Elsevier Patient Education © 2020 Else[employer] Inc. ° °

## 2019-08-01 NOTE — Progress Notes (Signed)
Telemedicine Encounter- SOAP NOTE Established Patient  This telephone encounter was conducted with the patient's (or proxy's) verbal consent via audio telecommunications: yes/no: Yes Patient was instructed to have this encounter in a suitably private space; and to only have persons present to whom they give permission to participate. In addition, patient identity was confirmed by use of name plus two identifiers (DOB and address).  I discussed the limitations, risks, security and privacy concerns of performing an evaluation and management service by telephone and the availability of in person appointments. I also discussed with the patient that there may be a patient responsible charge related to this service. The patient expressed understanding and agreed to proceed.  I spent a total of TIME; 0 MIN TO 60 MIN: 10 minutes talking with the patient or their proxy.  CC:  Needs a letter for school re: Covid   Subjective   Richard Griffin is a 23 y.o. established patient. Telephone visit today for a letter he can give his school in order to have a private room.  He reports having Covid late August (actual date unknown) and had his first negative test on 9/15.  We do not have any of those results.  Works at [employer] 3 days a week.  Mother is worried that with that exposure that he is high risk although he has no chronic medical problems.  Murat reports he has largely recovered from Covid with the exception of some residual chest tightness and ongoing anosmia.    He has no shortness of breath or organ damage that is known from Covid.       There are no active problems to display for this patient.   History reviewed. No pertinent past medical history.  No current outpatient medications on file.   No current facility-administered medications for this visit.     No Known Allergies  Social History   Socioeconomic History  . Marital status: Single    Spouse name: Not on file  .  Number of children: Not on file  . Years of education: Not on file  . Highest education level: Not on file  Occupational History  . Not on file  Social Needs  . Financial resource strain: Not on file  . Food insecurity    Worry: Not on file    Inability: Not on file  . Transportation needs    Medical: Not on file    Non-medical: Not on file  Tobacco Use  . Smoking status: Never Smoker  . Smokeless tobacco: Never Used  Substance and Sexual Activity  . Alcohol use: Yes    Comment: occasional  . Drug use: Not Currently    Types: Marijuana  . Sexual activity: Yes  Lifestyle  . Physical activity    Days per week: Not on file    Minutes per session: Not on file  . Stress: Not on file  Relationships  . Social Herbalist on phone: Not on file    Gets together: Not on file    Attends religious service: Not on file    Active member of club or organization: Not on file    Attends meetings of clubs or organizations: Not on file    Relationship status: Not on file  . Intimate partner violence    Fear of current or ex partner: Not on file    Emotionally abused: Not on file    Physically abused: Not on file    Forced  sexual activity: Not on file  Other Topics Concern  . Not on file  Social History Narrative  . Not on file    ROS  Review of Systems See HPI Constitution: No fevers or chills No malaise No diaphoresis Skin: No rash or itching Eyes: no blurry vision, no double vision ENT:  + for anosmia  Lungs:  Denies SOB or DOE.  Positive for chest tightness with deep breaths.  GU: no dysuria or hematuria Neuro: no dizziness or headaches   Objective    GEN: WDWN, NAD, Non-toxic, Alert & Oriented x 3   PSYCH: Normally interactive. Conversant.  Calm demeanor.    Vitals as reported by the patient: Today's Vitals   08/01/19 0947  Weight: (!) 305 lb (138.3 kg)  Height: 6' (1.829 m)    There are no diagnoses linked to this encounter.   We discussed that  since he has had Co-vid he is less risk to room with someone at this point.  He should still have antibodies given the proximity of his diagnosis.  While it's possible to get Co-vid again, it is less likely and usually involves a shorter, milder course than previous.  Explained to him that the private room is not medically necessary and to be aware that there may be other students for whom isolation is necessary in a private room due to medical problems or being immunocompromised.  I will write a letter stating he requested this accomodation but there is no medical necessity for a private room at this point.  It will be at the school's discretion.  Patient will send previous Co-vid tests through Mychart to [place].  It would be helpful to have the results.  He verbalized understanding.    I discussed the assessment and treatment plan with the patient. The patient was provided an opportunity to ask questions and all were answered. The patient agreed with the plan and demonstrated an understanding of the instructions.   The patient was advised to call back or seek an in-person evaluation if the symptoms worsen or if the condition fails to improve as anticipated.  I provided 10 minutes of non-face-to-face time during this encounter.  Elyse Jarvis, NP  Primary Care at [facility]

## 2019-08-01 NOTE — Progress Notes (Signed)
Cc: Prev had COVID+ 09/12.  Had loss of sense of smell/taste and cough.  No other symptoms.  Chest still feels tight but has since had neg test. Concerned about after care.  Mother worries about damage to kidneys from Grace although has no s/s r/t kidneys and wants note from provider stating pt needs his own space at work and own dorm room as he is "at high risk for contracting COVID again".

## 2020-08-30 ENCOUNTER — Ambulatory Visit: Payer: BLUE CROSS/BLUE SHIELD | Admitting: Sports Medicine

## 2020-12-30 ENCOUNTER — Encounter (HOSPITAL_COMMUNITY): Payer: Self-pay | Admitting: Emergency Medicine

## 2020-12-30 ENCOUNTER — Other Ambulatory Visit: Payer: Self-pay

## 2020-12-30 ENCOUNTER — Ambulatory Visit (HOSPITAL_COMMUNITY)
Admission: EM | Admit: 2020-12-30 | Discharge: 2020-12-30 | Disposition: A | Discharge status: Home / Self Care | Payer: 59 | Attending: Family Medicine | Admitting: Family Medicine

## 2020-12-30 DIAGNOSIS — L293 Anogenital pruritus, unspecified: Secondary | ICD-10-CM | POA: Insufficient documentation

## 2020-12-30 MED ORDER — DOXYCYCLINE HYCLATE 100 MG PO TABS: 100.0000 mg | ORAL_TABLET | Freq: Two times a day (BID) | ORAL | 0 refills | Status: DC

## 2020-12-30 MED ORDER — LIDOCAINE HCL (PF) 1 % IJ SOLN
INTRAMUSCULAR | Status: AC
  Filled 2020-12-30: qty 2

## 2020-12-30 MED ORDER — CEFTRIAXONE SODIUM 500 MG IJ SOLR
INTRAMUSCULAR | Status: AC
  Filled 2020-12-30: qty 500

## 2020-12-30 MED ORDER — CEFTRIAXONE SODIUM 500 MG IJ SOLR
500.0000 mg | Freq: Once | INTRAMUSCULAR | Status: AC
  Administered 2020-12-30: 500 mg via INTRAMUSCULAR

## 2020-12-30 NOTE — ED Provider Notes (Signed)
[facility]    CSN: [id number] Arrival date & time: 12/30/20  1056      History   Chief Complaint Chief Complaint  Patient presents with  . Exposure to STD    HPI Richard Griffin is a 25 y.o. male.   Here today with about 2 days of penile itching and mild dysuria. Denies rashes, discharge, hematuria, pelvic or abdominal pain, fever, chills. Not trying anything OTC for sxs. States has had these sxs in the past and it was an STI. No known exposures.      Past Medical History:  Diagnosis Date  . Advice given about COVID-19 virus by telephone 08/01/2019    Patient Active Problem List   Diagnosis Date Noted  . Advice given about COVID-19 virus by telephone 08/01/2019    Past Surgical History:  Procedure Laterality Date  . CLOSED REDUCTION WRIST FRACTURE  10/26/2011   Procedure: CLOSED REDUCTION WRIST;  Surgeon: Vania Rea Supple;  Location: [facility];  Service: Orthopedics;  Laterality: Left;  closed reduction left distal radius and ulna  . FRACTURE SURGERY         Home Medications    Prior to Admission medications   Medication Sig Start Date End Date Taking? Authorizing Provider  doxycycline (VIBRA-TABS) 100 MG tablet Take 1 tablet (100 mg total) by mouth 2 (two) times daily. 12/30/20  Yes Particia Nearing, PA-C    Family History Family History  Problem Relation Age of Onset  . Heart disease Sister   . Hyperlipidemia Sister   . Hypertension Sister   . Hypertension Brother   . Mental illness Brother   . Mental retardation Maternal Grandmother   . Mental retardation Maternal Grandfather   . Hypertension Maternal Grandfather   . Hyperlipidemia Maternal Grandfather   . Diabetes Maternal Grandfather     Social History Social History   Tobacco Use  . Smoking status: Never Smoker  . Smokeless tobacco: Never Used  Vaping Use  . Vaping Use: Never used  Substance Use Topics  . Alcohol use: Yes    Comment: occasional  . Drug use: Not Currently     Types: Marijuana     Allergies   Patient has no known allergies.   Review of Systems Review of Systems PER HPI    Physical Exam Triage Vital Signs ED Triage Vitals  Enc Vitals Group     BP 12/30/20 1136 127/73     Pulse Rate 12/30/20 1136 79     Resp 12/30/20 1136 20     Temp 12/30/20 1136 98.7 F (37.1 C)     Temp Source 12/30/20 1136 Oral     SpO2 12/30/20 1136 99 %     Weight 12/30/20 1134 (!) 303 lb (137.4 kg)     Height 12/30/20 1134 6' (1.829 m)     Head Circumference --      Peak Flow --      Pain Score 12/30/20 1136 0     Pain Loc --      Pain Edu? --      Excl. in GC? --    No data found.  Updated Vital Signs BP 127/73 (BP Location: Right Arm)   Pulse 79   Temp 98.7 F (37.1 C) (Oral)   Resp 20   Ht 6' (1.829 m)   Wt (!) 303 lb (137.4 kg)   SpO2 99%   BMI 41.09 kg/m   Visual Acuity Right Eye Distance:   Left Eye Distance:  Bilateral Distance:    Right Eye Near:   Left Eye Near:    Bilateral Near:     Physical Exam Vitals and nursing note reviewed.  Constitutional:      Appearance: Normal appearance.  HENT:     Head: Atraumatic.  Eyes:     Extraocular Movements: Extraocular movements intact.     Conjunctiva/sclera: Conjunctivae normal.  Cardiovascular:     Rate and Rhythm: Normal rate and regular rhythm.  Pulmonary:     Effort: Pulmonary effort is normal.     Breath sounds: Normal breath sounds.  Abdominal:     General: Bowel sounds are normal. There is no distension.     Palpations: Abdomen is soft.     Tenderness: There is no abdominal tenderness. There is no guarding.  Genitourinary:    Comments: GU exam declined Musculoskeletal:        General: Normal range of motion.     Cervical back: Normal range of motion and neck supple.  Skin:    General: Skin is warm and dry.  Neurological:     General: No focal deficit present.     Mental Status: He is oriented to person, place, and time.  Psychiatric:        Mood and Affect:  Mood normal.        Thought Content: Thought content normal.        Judgment: Judgment normal.     UC Treatments / Results  Labs (all labs ordered are listed, but only abnormal results are displayed) Labs Reviewed  CYTOLOGY, (ORAL, ANAL, URETHRAL) ANCILLARY ONLY    EKG   Radiology No results found.  Procedures Procedures (including critical care time)  Medications Ordered in UC Medications  cefTRIAXone (ROCEPHIN) injection 500 mg (has no administration in time range)    Initial Impression / Assessment and Plan / UC Course  I have reviewed the triage vital signs and the nursing notes.  Pertinent labs & imaging results that were available during my care of the patient were reviewed by me and considered in my medical decision making (see chart for details).     Cytology swab pending, exam and vitals reassuring. He is requesting treatment today despite these results pending. Discussed risks vs benefit with this. IM rocephin and doxycycline, abstinence until results return.   Final Clinical Impressions(s) / UC Diagnoses   Final diagnoses:  Itching of male genitalia   Discharge Instructions   None    ED Prescriptions    Medication Sig Dispense Auth. Provider   doxycycline (VIBRA-TABS) 100 MG tablet Take 1 tablet (100 mg total) by mouth 2 (two) times daily. 14 tablet Particia Nearing, [place]     PDMP not reviewed this encounter.   Particia Nearing, [place] 12/30/20 1215

## 2020-12-30 NOTE — ED Triage Notes (Signed)
Pt presents today requesting testing for Chylamidia. His symptoms include itching, dysuria; denies drainage. Treated for same in the past.

## 2021-01-01 LAB — CYTOLOGY, (ORAL, ANAL, URETHRAL) ANCILLARY ONLY
Chlamydia: NEGATIVE
Comment: NEGATIVE
Comment: NEGATIVE
Comment: NORMAL
Neisseria Gonorrhea: NEGATIVE
Trichomonas: NEGATIVE

## 2021-04-01 ENCOUNTER — Encounter: Payer: Self-pay | Admitting: Physician Assistant

## 2021-04-01 ENCOUNTER — Telehealth: Payer: 59 | Admitting: Physician Assistant

## 2021-04-01 DIAGNOSIS — Z760 Encounter for issue of repeat prescription: Secondary | ICD-10-CM | POA: Diagnosis not present

## 2021-04-01 MED ORDER — TRIAMCINOLONE ACETONIDE 0.1 % EX CREA: 1.0000 "application " | TOPICAL_CREAM | Freq: Two times a day (BID) | CUTANEOUS | 0 refills | Status: DC

## 2021-04-01 NOTE — Progress Notes (Signed)
Mr. Richard, Griffin are scheduled for a virtual visit with your provider today.    Just as we do with appointments in the office, we must obtain your consent to participate.  Your consent will be active for this visit and any virtual visit you may have with one of our providers in the next 365 days.    If you have a MyChart account, I can also send a copy of this consent to you electronically.  All virtual visits are billed to your insurance company just like a traditional visit in the office.  As this is a virtual visit, video technology does not allow for your provider to perform a traditional examination.  This may limit your provider's ability to fully assess your condition.  If your provider identifies any concerns that need to be evaluated in person or the need to arrange testing such as labs, EKG, etc, we will make arrangements to do so.    Although advances in technology are sophisticated, we cannot ensure that it will always work on either your end or our end.  If the connection with a video visit is poor, we may have to switch to a telephone visit.  With either a video or telephone visit, we are not always able to ensure that we have a secure connection.   I need to obtain your verbal consent now.   Are you willing to proceed with your visit today?   Richard Griffin has provided verbal consent on 04/01/2021 for a virtual visit (video or telephone).   Richard Griffin, Richard Griffin 04/01/2021  3:28 PM   Date:  04/01/2021   ID:  Richard Griffin, DOB 02/04/1996, MRN [id number]  Patient Location: Home Provider Location: Home Office   Participants: Patient and Provider for Visit and Wrap up  Method of visit: Video  Location of Patient: Home Location of Provider: Home Office Consent was obtain for visit over the video. Services rendered by provider: Visit was performed via video  A video enabled telemedicine application was used and I verified that I am speaking with the correct person using two  identifiers.  PCP:  Default, Provider, MD   Chief Complaint:  Medication refill - Triamcinolone  History of Present Illness:    Richard Griffin is a 25 y.o. male with history as stated below. Presents video telehealth for an acute care visit.  Pt reports "razor bumps" on the back of his neck about 1 year ago.  He was given Triamcinolone cream 0.1% that worked well. The rash healed and was gone for some time, but returned last week.  Pt reports he is out of the medication and simply needs a refill. No new or worsening symptoms.  Pt denies purulent drainage, swelling, and pain.  Denies fever, chills, N/V or neck pain.  No treatments prior to today's visit.   Past Medical, Surgical, Social History, Allergies, and Medications have been Reviewed.  Patient Active Problem List   Diagnosis Date Noted  . Advice given about COVID-19 virus by telephone 08/01/2019    Social History   Tobacco Use  . Smoking status: Never Smoker  . Smokeless tobacco: Never Used  Substance Use Topics  . Alcohol use: Yes    Comment: occasional     Current Outpatient Medications:  .  triamcinolone cream (KENALOG) 0.1 %, Apply 1 application topically 2 (two) times daily., Disp: 30 g, Rfl: 0 .  doxycycline (VIBRA-TABS) 100 MG tablet, Take 1 tablet (100 mg total) by mouth 2 (two) times daily., Disp: 14  tablet, Rfl: 0   No Known Allergies   Review of Systems  Constitutional: Negative for chills and fever.  HENT: Negative for congestion, ear pain and sore throat.   Eyes: Negative for blurred vision and double vision.  Respiratory: Negative for cough, shortness of breath and wheezing.   Cardiovascular: Negative for chest pain, palpitations and leg swelling.  Gastrointestinal: Negative for abdominal pain, diarrhea, nausea and vomiting.  Genitourinary: Negative for dysuria.  Musculoskeletal: Negative for myalgias.  Skin: Positive for rash.  Neurological: Negative for loss of consciousness, weakness and headaches.   Psychiatric/Behavioral: The patient is not nervous/anxious.    See HPI for history of present illness.  Physical Exam Vitals and nursing note reviewed.  Constitutional:      General: He is not in acute distress.    Appearance: He is not diaphoretic.  HENT:     Head: Normocephalic.  Eyes:     General: No scleral icterus.    Conjunctiva/sclera: Conjunctivae normal.  Cardiovascular:     Rate and Rhythm: Normal rate and regular rhythm.     Pulses: Normal pulses.          Radial pulses are 2+ on the right side and 2+ on the left side.  Pulmonary:     Effort: No tachypnea, accessory muscle usage, prolonged expiration, respiratory distress or retractions.     Breath sounds: No stridor.     Comments: Equal chest rise. No increased work of breathing. Abdominal:     General: There is no distension.     Palpations: Abdomen is soft.     Tenderness: There is no abdominal tenderness. There is no guarding or rebound.  Musculoskeletal:     Cervical back: Normal range of motion.     Comments: Moves all extremities equally and without difficulty.  Skin:    General: Skin is warm and dry.     Capillary Refill: Capillary refill takes less than 2 seconds.     Comments: Small area of rash and the base of the hairline.  Some with scabs and some with small pustules.  No large or extending area of infection.   Neurological:     Mental Status: He is alert.     GCS: GCS eye subscore is 4. GCS verbal subscore is 5. GCS motor subscore is 6.     Comments: Speech is clear and goal oriented.  Psychiatric:        Mood and Affect: Mood normal.               A&P  1. Medication refill  - triamcinolone to treat rash.  Has previously used the same, but has run out of the prescription.  No new or worsening symptoms.  No symptoms of systemic infection.   Patient voiced understanding and agreement to plan.   Time:   Today, I have spent 15 minutes with the patient with telehealth technology discussing  the above problems, reviewing the chart, previous notes, medications and orders.    Tests Ordered: No orders of the defined types were placed in this encounter.   Medication Changes: Meds ordered this encounter  Medications  . triamcinolone cream (KENALOG) 0.1 %    Sig: Apply 1 application topically 2 (two) times daily.    Dispense:  30 g    Refill:  0     Disposition:  Follow up with your PCP as needed  Signed, Richard Griffin, Richard Griffin  04/01/2021 3:36 PM

## 2021-04-01 NOTE — Patient Instructions (Signed)
1. Medication refill  - triamcinolone to treat rash.  Has previously used the same, but has run out of the prescription.  No new or worsening symptoms.  No symptoms of systemic infection.

## 2021-07-30 ENCOUNTER — Telehealth: Payer: 59 | Admitting: Physician Assistant

## 2021-07-30 DIAGNOSIS — Z760 Encounter for issue of repeat prescription: Secondary | ICD-10-CM | POA: Diagnosis not present

## 2021-07-30 MED ORDER — TRIAMCINOLONE ACETONIDE 0.1 % EX CREA: 1.0000 "application " | TOPICAL_CREAM | Freq: Two times a day (BID) | CUTANEOUS | 1 refills | Status: AC

## 2021-07-30 NOTE — Progress Notes (Signed)
Virtual Visit Consent   Richard Griffin, you are scheduled for a virtual visit with a [facility] provider today.     Just as with appointments in the office, your consent must be obtained to participate.  Your consent will be active for this visit and any virtual visit you may have with one of our providers in the next 365 days.     If you have a MyChart account, a copy of this consent can be sent to you electronically.  All virtual visits are billed to your insurance company just like a traditional visit in the office.    As this is a virtual visit, video technology does not allow for your provider to perform a traditional examination.  This may limit your provider's ability to fully assess your condition.  If your provider identifies any concerns that need to be evaluated in person or the need to arrange testing (such as labs, EKG, etc.), we will make arrangements to do so.     Although advances in technology are sophisticated, we cannot ensure that it will always work on either your end or our end.  If the connection with a video visit is poor, the visit may have to be switched to a telephone visit.  With either a video or telephone visit, we are not always able to ensure that we have a secure connection.     I need to obtain your verbal consent now.   Are you willing to proceed with your visit today?    [name] has provided verbal consent on 07/30/2021 for a virtual visit (video or telephone).   [name], [place]   Date: 07/30/2021 9:24 AM   Virtual Visit via Video Note   I, [name], connected with  Samarion Ehle  ([id number], [birth year]) on 07/30/21 at  9:15 AM EDT by a video-enabled telemedicine application and verified that I am speaking with the correct person using two identifiers.  Location: Patient: Virtual Visit Location Patient: Home Provider: Virtual Visit Location Provider: Home Office   I discussed the limitations of evaluation and management by  telemedicine and the availability of in person appointments. The patient expressed understanding and agreed to proceed.    History of Present Illness: Richard Griffin is a 25 y.o. who identifies as a male who was assigned male at birth, and is being seen today for need of a refill of his triamcinolone for a periodic razor bump rash of posterior neck. Is currently without a PCP. Has been out of medication for more than a month with areas starting to recur. Denies any pain. Notes occasional itch. Denies ever having similar rash elsewhere. [name]  HPI: HPI  Problems:  Patient Active Problem List   Diagnosis Date Noted   Advice given about COVID-19 virus by telephone 08/01/2019    Allergies: No Known Allergies Medications:  Current Outpatient Medications:    triamcinolone cream (KENALOG) 0.1 %, Apply 1 application topically 2 (two) times daily., Disp: 30 g, Rfl: 1  Observations/Objective: Patient is well-developed, well-nourished in no acute distress.  Resting comfortably at home.  Head is normocephalic, atraumatic.  No labored breathing. Speech is clear and coherent with logical content.  Patient is alert and oriented at baseline.   Assessment and Plan: 1. Medication refill - triamcinolone cream (KENALOG) 0.1 %; Apply 1 application topically 2 (two) times daily.  Dispense: 30 g; Refill: 1 Will give one-time fill. Link for PCP set up placed in his AVS. He will need to  follow-up with them for ongoing management of this recurring issue.   Follow Up Instructions: I discussed the assessment and treatment plan with the patient. The patient was provided an opportunity to ask questions and all were answered. The patient agreed with the plan and demonstrated an understanding of the instructions.  A copy of instructions were sent to the patient via MyChart.  The patient was advised to call back or seek an in-person evaluation if the symptoms worsen or if the condition fails to improve as  anticipated.  Time:  I spent 10 minutes with the patient via telehealth technology discussing the above problems/concerns.    [name], PA-C

## 2021-07-30 NOTE — Patient Instructions (Signed)
  Richard Griffin, thank you for joining [name], PA-C for today's virtual visit.  While this provider is not your primary care provider (PCP), if your PCP is located in our provider database this encounter information will be shared with them immediately following your visit.  Consent: (Patient) Richard Griffin provided verbal consent for this virtual visit at the beginning of the encounter.  Current Medications:  Current Outpatient Medications:    doxycycline (VIBRA-TABS) 100 MG tablet, Take 1 tablet (100 mg total) by mouth 2 (two) times daily., Disp: 14 tablet, Rfl: 0   triamcinolone cream (KENALOG) 0.1 %, Apply 1 application topically 2 (two) times daily., Disp: 30 g, Rfl: 0   Medications ordered in this encounter:  No orders of the defined types were placed in this encounter.    *If you need refills on other medications prior to your next appointment, please contact your pharmacy*  Follow-Up: Call back or seek an in-person evaluation if the symptoms worsen or if the condition fails to improve as anticipated.  Other Instructions Medication has been refilled. Try to keep neck clean and dry.  If you wear a lot of baseball caps/hats that can sometimes help cause this issue as sweat gets trapped in the area. Consider applying some OTC witch hazel astringent to the area for a few days to see if this helps as well.    If you have been instructed to have an in-person evaluation today at a local Urgent Care facility, please use the link below. It will take you to a list of all of our available [facility], including address, phone number and hours of operation. Please do not delay care.  [facility]  If you or a family member do not have a primary care provider, use the link below to schedule a visit and establish care. When you choose a [facility] primary care physician or advanced practice provider, you gain a long-term partner in health. Find a Primary  Care Provider  Learn more about [facility]'s in-office and virtual care options: [facility] - Get Care Now

## 2021-11-06 ENCOUNTER — Telehealth: Payer: 59 | Admitting: Physician Assistant

## 2021-11-06 DIAGNOSIS — L739 Follicular disorder, unspecified: Secondary | ICD-10-CM

## 2021-11-06 MED ORDER — MUPIROCIN CALCIUM 2 % EX CREA: 1.0000 "application " | TOPICAL_CREAM | Freq: Two times a day (BID) | CUTANEOUS | 0 refills | Status: AC

## 2021-11-06 NOTE — Patient Instructions (Signed)
1. Folliculitis  - prescription for  Bactroban topical  - continue to keep clean with warm soap and water  - set up PCP appointment  - use dermatology referrals to set up an appointment  [facility] [phone]  [facility] D[facility] [phone]  [facility] Dermatology [phone]  [facility] Dermatology [phone]

## 2021-11-06 NOTE — Progress Notes (Signed)
Mr. Richard, Griffin are scheduled for a virtual visit with your provider today.    Just as we do with appointments in the office, we must obtain your consent to participate.  Your consent will be active for this visit and any virtual visit you may have with one of our providers in the next 365 days.    If you have a MyChart account, I can also send a copy of this consent to you electronically.  All virtual visits are billed to your insurance company just like a traditional visit in the office.  As this is a virtual visit, video technology does not allow for your provider to perform a traditional examination.  This may limit your provider's ability to fully assess your condition.  If your provider identifies any concerns that need to be evaluated in person or the need to arrange testing such as labs, EKG, etc, we will make arrangements to do so.    Although advances in technology are sophisticated, we cannot ensure that it will always work on either your end or our end.  If the connection with a video visit is poor, we may have to switch to a telephone visit.  With either a video or telephone visit, we are not always able to ensure that we have a secure connection.   I need to obtain your verbal consent now.   Are you willing to proceed with your visit today?   Richard Griffin has provided verbal consent on 11/06/2021 for a virtual visit (video or telephone).   Dierdre Forth, PA-C 11/06/2021  3:31 PM   Date:  11/06/2021   ID:  Richard Griffin, DOB 08-27-1996, MRN [id number]  Patient Location: Home Provider Location: Home Office   Participants: Patient and Provider for Visit and Wrap up  Method of visit: Video  Location of Patient: Home Location of Provider: Home Office Consent was obtain for visit over the video. Services rendered by provider: Visit was performed via video  A video enabled telemedicine application was used and I verified that I am speaking with the correct person using two  identifiers.  PCP:  Default, Provider, MD   Chief Complaint:  rash to the back of the neck  History of Present Illness:    Richard Griffin is a 25 y.o. male with history as stated below. Presents video telehealth for an acute care visit.  Pt reports in the past he has used the video visits for refills of a cream that he uses on the back of his neck.  He reports using Kenalog without relief.  He has never seen a dermatologist.  Denies fevers, chills.  Denies oozing from the bumps.  Sometimes the bumps itch.  No other treatments.  No aggravating or alleviating factors.  He has been keeping it clean and dry, but it has not healed.  Pt reports this has been going on for years, but returned 6 months ago.   Pt reports he does not have a PCP.   No other aggravating or relieving factors.  No other c/o.  Past Medical, Surgical, Social History, Allergies, and Medications have been Reviewed.  Patient Active Problem List   Diagnosis Date Noted   Advice given about COVID-19 virus by telephone 08/01/2019    Social History   Tobacco Use   Smoking status: Never   Smokeless tobacco: Never  Substance Use Topics   Alcohol use: Yes    Comment: occasional     Current Outpatient Medications:    mupirocin cream (BACTROBAN)  2 %, Apply 1 application topically 2 (two) times daily., Disp: 15 g, Rfl: 0   triamcinolone cream (KENALOG) 0.1 %, Apply 1 application topically 2 (two) times daily., Disp: 30 g, Rfl: 1   No Known Allergies   Review of Systems  Constitutional:  Negative for chills and fever.  HENT:  Negative for congestion, ear pain and sore throat.   Eyes:  Negative for blurred vision and double vision.  Respiratory:  Negative for cough, shortness of breath and wheezing.   Cardiovascular:  Negative for chest pain, palpitations and leg swelling.  Gastrointestinal:  Negative for abdominal pain, diarrhea, nausea and vomiting.  Genitourinary:  Negative for dysuria.  Musculoskeletal:  Negative  for myalgias.  Skin:  Positive for itching and rash.  Neurological:  Negative for loss of consciousness, weakness and headaches.  Psychiatric/Behavioral:  The patient is not nervous/anxious.   See HPI for history of present illness.  Physical Exam Constitutional:      General: He is not in acute distress.    Appearance: Normal appearance. He is well-developed. He is not ill-appearing.  HENT:     Head: Normocephalic and atraumatic.     Nose: Nose normal.  Eyes:     General: No scleral icterus.    Conjunctiva/sclera: Conjunctivae normal.  Neck:     Comments: Small pustules to the back of the neck at the hairline Pulmonary:     Effort: Pulmonary effort is normal.  Musculoskeletal:        General: Normal range of motion.     Cervical back: Normal range of motion.  Skin:    Coloration: Skin is not pale.  Neurological:     General: No focal deficit present.     Mental Status: He is alert.  Psychiatric:        Mood and Affect: Mood normal.              A&P  1. Folliculitis  - prescription for  Bactroban topical  - continue to keep clean with warm soap and water  - set up PCP appointment  - use dermatology referrals to set up an appointment   Patient voiced understanding and agreement to plan.   Time:   Today, I have spent 15 minutes with the patient with telehealth technology discussing the above problems, reviewing the chart, previous notes, medications and orders.    Tests Ordered: No orders of the defined types were placed in this encounter.   Medication Changes: Meds ordered this encounter  Medications   mupirocin cream (BACTROBAN) 2 %    Sig: Apply 1 application topically 2 (two) times daily.    Dispense:  15 g    Refill:  0     Disposition:  Follow up PCP and dermatology  Signed, Dierdre Forth, PA-C  11/06/2021 3:38 PM

## 2023-05-01 ENCOUNTER — Encounter (HOSPITAL_COMMUNITY): Payer: Self-pay

## 2023-05-01 ENCOUNTER — Ambulatory Visit (HOSPITAL_COMMUNITY)
Admission: EM | Admit: 2023-05-01 | Discharge: 2023-05-01 | Disposition: A | Discharge status: Home / Self Care | Payer: Self-pay | Attending: Emergency Medicine | Admitting: Emergency Medicine

## 2023-05-01 DIAGNOSIS — S91331A Puncture wound without foreign body, right foot, initial encounter: Secondary | ICD-10-CM

## 2023-05-01 DIAGNOSIS — Z23 Encounter for immunization: Secondary | ICD-10-CM

## 2023-05-01 MED ORDER — TETANUS-DIPHTH-ACELL PERTUSSIS 5-2.5-18.5 LF-MCG/0.5 IM SUSY
0.5000 mL | PREFILLED_SYRINGE | Freq: Once | INTRAMUSCULAR | Status: AC
  Administered 2023-05-01: 0.5 mL via INTRAMUSCULAR

## 2023-05-01 MED ORDER — TETANUS-DIPHTH-ACELL PERTUSSIS 5-2.5-18.5 LF-MCG/0.5 IM SUSY
PREFILLED_SYRINGE | INTRAMUSCULAR | Status: AC
  Filled 2023-05-01: qty 0.5

## 2023-05-01 NOTE — ED Triage Notes (Addendum)
Pt reports he step on a dirty nail today. Pt reports right foot pain.  Last Tdap-unknown

## 2023-05-01 NOTE — ED Provider Notes (Signed)
[facility]    CSN: [id number] Arrival date & time: 05/01/23  1236      History   Chief Complaint Chief Complaint  Patient presents with   Foot Injury    HPI Richard Griffin is a 27 y.o. male.   Patient presents for evaluation of his right foot after stepping on a nail today.  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  Painful to bear weight but able to do so.  Bleeding has subsided.  Unsure of last tetanus.   Past Medical History:  Diagnosis Date   Advice given about COVID-19 virus by telephone 08/01/2019    Patient Active Problem List   Diagnosis Date Noted   Advice given about COVID-19 virus by telephone 08/01/2019    Past Surgical History:  Procedure Laterality Date   CLOSED REDUCTION WRIST FRACTURE  10/26/2011   Procedure: CLOSED REDUCTION WRIST;  Surgeon: Vania Rea Supple;  Location: [facility];  Service: Orthopedics;  Laterality: Left;  closed reduction left distal radius and ulna   FRACTURE SURGERY         Home Medications    Prior to Admission medications   Medication Sig Start Date End Date Taking? Authorizing Provider  mupirocin cream (BACTROBAN) 2 % Apply 1 application topically 2 (two) times daily. 11/06/21   Muthersbaugh, Dahlia Client, PA-C  triamcinolone cream (KENALOG) 0.1 % Apply 1 application topically 2 (two) times daily. 07/30/21   Waldon Merl, PA-C    Family History Family History  Problem Relation Age of Onset   Heart disease Sister    Hyperlipidemia Sister    Hypertension Sister    Hypertension Brother    Mental illness Brother    Mental retardation Maternal Grandmother    Mental retardation Maternal Grandfather    Hypertension Maternal Grandfather    Hyperlipidemia Maternal Grandfather    Diabetes Maternal Grandfather     Social History Social History   Tobacco Use   Smoking status: Never   Smokeless tobacco: Never  Vaping Use   Vaping Use: Never used  Substance Use Topics   Alcohol use: Yes    Comment:  occasional   Drug use: Not Currently    Types: Marijuana     Allergies   Patient has no known allergies.   Review of Systems Review of Systems   Physical Exam Triage Vital Signs ED Triage Vitals [05/01/23 1338]  Enc Vitals Group     BP (!) 146/76     Pulse Rate 75     Resp 18     Temp 98 F (36.7 C)     Temp Source Oral     SpO2 98 %     Weight      Height      Head Circumference      Peak Flow      Pain Score      Pain Loc      Pain Edu?      Excl. in GC?    No data found.  Updated Vital Signs BP (!) 146/76 (BP Location: Left Arm)   Pulse 75   Temp 98 F (36.7 C) (Oral)   Resp 18   SpO2 98%   Visual Acuity Right Eye Distance:   Left Eye Distance:   Bilateral Distance:    Right Eye Near:   Left Eye Near:    Bilateral Near:     Physical Exam Constitutional:      Appearance: Normal appearance.  Eyes:  Extraocular Movements: Extraocular movements intact.  Pulmonary:     Effort: Pulmonary effort is normal.  Feet:     Comments: 0.5 cm puncture wound present to the center of the right heel Neurological:     Mental Status: He is alert and oriented to person, place, and time. Mental status is at baseline.      UC Treatments / Results  Labs (all labs ordered are listed, but only abnormal results are displayed) Labs Reviewed - No data to display  EKG   Radiology No results found.  Procedures Procedures (including critical care time)  Medications Ordered in UC Medications  Tdap (BOOSTRIX) injection 0.5 mL (has no administration in time range)    Initial Impression / Assessment and Plan / UC Course  I have reviewed the triage vital signs and the nursing notes.  Pertinent labs & imaging results that were available during my care of the patient were reviewed by me and considered in my medical decision making (see chart for details).  Puncture wound of right foot, initial encounter  Cleansed with chlorhexidine, applied Bactroban and  Band-Aid, advised daily cleansing with soap and water at home with application of over-the-counter antibiotic cream, advised to cover until healed and to avoid walking barefoot to prevent infection, tetanus injection given recommended supportive care through ice heat and over-the-counter analgesics for discomfort, given strict precautions for any signs of infection to return for reevaluation Final Clinical Impressions(s) / UC Diagnoses   Final diagnoses:  None   Discharge Instructions   None    ED Prescriptions   None    PDMP not reviewed this encounter.   Valinda Hoar, NP 05/01/23 1408

## 2023-05-01 NOTE — Discharge Instructions (Signed)
He has been cleansed here in the office with chlorhexidine and a topical antibiotic cream was applied  Clean once a day at home with soap and water, may apply topical Neosporin or triple antibiotic or similar product and cover with a Band-Aid until healed  Until wound has close please refrain from walking barefoot as this increases your chances of it becoming infected  You may use ice or heat over the affected area if painful  You may use Tylenol or Motrin every 6 hours as needed if painful  Tetanus injection has been given here in office, good for 10 years  Please return for any concerns regarding infection such as increased swelling, increased pain, new redness or puslike drainage

## 2023-06-07 ENCOUNTER — Telehealth (HOSPITAL_COMMUNITY): Payer: Self-pay | Admitting: Emergency Medicine

## 2023-06-07 ENCOUNTER — Ambulatory Visit (INDEPENDENT_AMBULATORY_CARE_PROVIDER_SITE_OTHER): Payer: Self-pay

## 2023-06-07 ENCOUNTER — Encounter (HOSPITAL_COMMUNITY): Payer: Self-pay | Admitting: Emergency Medicine

## 2023-06-07 ENCOUNTER — Ambulatory Visit (HOSPITAL_COMMUNITY)
Admission: EM | Admit: 2023-06-07 | Discharge: 2023-06-07 | Disposition: A | Discharge status: Home / Self Care | Payer: Self-pay | Attending: Emergency Medicine | Admitting: Emergency Medicine

## 2023-06-07 DIAGNOSIS — R0789 Other chest pain: Secondary | ICD-10-CM

## 2023-06-07 MED ORDER — CYCLOBENZAPRINE HCL 10 MG PO TABS: 10.0000 mg | ORAL_TABLET | Freq: Two times a day (BID) | ORAL | 0 refills | Status: AC | PRN

## 2023-06-07 MED ORDER — PREDNISONE 20 MG PO TABS: 40.0000 mg | ORAL_TABLET | Freq: Every day | ORAL | 0 refills | Status: AC
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
## 2023-06-07 NOTE — Discharge Instructions (Addendum)
X-ray of the left ribs pending, you will be called with results  X-ray of the left clavicle pending, you will be called with results  Symptoms today are most likely muscular  Begin use of prednisone every morning with food for 5 days to reduce inflammation which in turn will help with pain, may take Tylenol in addition to this as needed  May use muscle relaxant twice daily for additional comfort, be mindful of this will make you feel drowsy  May use heat over the affected area in 10 to 15-minute intervals  May massage and stretch area as tolerated  Place pillows underneath the arm and behind the back whenever sitting or lying to provide support and cushion the body  If your symptoms continue to persist or worsen you may follow-up with orthopedic doctor for reevaluation, information is listed on front page

## 2023-06-07 NOTE — Telephone Encounter (Signed)
Reporting left rib and left clavicle x-ray results, negative, 2 patient identifiers used, recommended continuing treatment plan as discussed in clinic
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
## 2023-12-20 ENCOUNTER — Emergency Department (HOSPITAL_COMMUNITY)
Admission: EM | Admit: 2023-12-20 | Discharge: 2023-12-20 | Disposition: A | Discharge status: Home / Self Care | Payer: Self-pay | Attending: Emergency Medicine | Admitting: Emergency Medicine

## 2023-12-20 ENCOUNTER — Other Ambulatory Visit: Payer: Self-pay

## 2023-12-20 DIAGNOSIS — R112 Nausea with vomiting, unspecified: Secondary | ICD-10-CM | POA: Diagnosis not present

## 2023-12-20 DIAGNOSIS — E860 Dehydration: Secondary | ICD-10-CM | POA: Diagnosis not present

## 2023-12-20 DIAGNOSIS — R1084 Generalized abdominal pain: Secondary | ICD-10-CM | POA: Diagnosis present

## 2023-12-20 DIAGNOSIS — R197 Diarrhea, unspecified: Secondary | ICD-10-CM | POA: Insufficient documentation

## 2023-12-20 LAB — COMPREHENSIVE METABOLIC PANEL
ALT: 35 U/L (ref 0–44)
AST: 27 U/L (ref 15–41)
Albumin: 3.7 g/dL (ref 3.5–5.0)
Alkaline Phosphatase: 40 U/L (ref 38–126)
Anion gap: 11 (ref 5–15)
BUN: 9 mg/dL (ref 6–20)
CO2: 24 mmol/L (ref 22–32)
Calcium: 8.8 mg/dL — ABNORMAL LOW (ref 8.9–10.3)
Chloride: 105 mmol/L (ref 98–111)
Creatinine, Ser: 1.5 mg/dL — ABNORMAL HIGH (ref 0.61–1.24)
GFR, Estimated: >60 mL/min (ref >60)
Glucose, Bld: 100 mg/dL — ABNORMAL HIGH (ref 70–99)
Potassium: 3.9 mmol/L (ref 3.5–5.1)
Sodium: 140 mmol/L (ref 135–145)
Total Bilirubin: 0.5 mg/dL (ref 0.0–1.2)
Total Protein: 6.9 g/dL (ref 6.5–8.1)

## 2023-12-20 LAB — CBC
HCT: 56.2 % — ABNORMAL HIGH (ref 39.0–52.0)
Hemoglobin: 17.4 g/dL — ABNORMAL HIGH (ref 13.0–17.0)
MCH: 28.3 pg (ref 26.0–34.0)
MCHC: 31 g/dL (ref 30.0–36.0)
MCV: 91.4 fL (ref 80.0–100.0)
Platelets: 130 10*3/uL — ABNORMAL LOW (ref 150–400)
RBC: 6.15 MIL/uL — ABNORMAL HIGH (ref 4.22–5.81)
RDW: 13.9 % (ref 11.5–15.5)
WBC: 5.5 10*3/uL (ref 4.0–10.5)
nRBC: 0 % (ref 0.0–0.2)

## 2023-12-20 LAB — LIPASE, BLOOD: Lipase: 38 U/L (ref 11–51)

## 2023-12-20 MED ORDER — ONDANSETRON HCL 4 MG PO TABS: 4.0000 mg | ORAL_TABLET | Freq: Three times a day (TID) | ORAL | 0 refills | Status: AC | PRN

## 2023-12-20 MED ORDER — ONDANSETRON HCL 4 MG/2ML IJ SOLN
4.0000 mg | Freq: Once | INTRAMUSCULAR | Status: AC
  Administered 2023-12-20: 4 mg via INTRAVENOUS
  Filled 2023-12-20: qty 2

## 2023-12-20 MED ORDER — SODIUM CHLORIDE 0.9 % IV BOLUS
1000.0000 mL | Freq: Once | INTRAVENOUS | Status: AC
  Administered 2023-12-20: 1000 mL via INTRAVENOUS

## 2023-12-20 MED ORDER — LOPERAMIDE HCL 2 MG PO CAPS: 2.0000 mg | ORAL_CAPSULE | Freq: Four times a day (QID) | ORAL | 0 refills | Status: AC | PRN

## 2023-12-20 NOTE — ED Notes (Signed)
 Pt able to hold down the fluids wo n/v.

## 2023-12-20 NOTE — ED Triage Notes (Signed)
 Pt. Stated, I've had N/V/D since Saturday night with stomach cramping.

## 2023-12-20 NOTE — ED Provider Notes (Signed)
 [place] [facility] Provider Note   CSN: [id number] Arrival date & time: 12/20/23  0865     History  Chief Complaint  Patient presents with   Abdominal Pain   Emesis   Nausea   Diarrhea    Richard Griffin is a 28 y.o. male.  He has no significant past medical history.  Complaining of nausea vomiting diarrhea, feeling dehydrated, muscle cramps that started since night before yesterday.  He has not been able to drink or eat since then.  No fever.  Has some abdominal cramps but no significant pain.  No cough or shortness of breath.  No recent travel but does have a roommate who is sick with similar.  The history is provided by the patient.  Abdominal Pain Pain location:  Generalized Pain quality: cramping   Pain severity:  Mild Onset quality:  Gradual Duration:  2 days Timing:  Intermittent Progression:  Unchanged Chronicity:  New Context: not trauma   Relieved by:  None tried Worsened by:  Nothing Ineffective treatments:  None tried Associated symptoms: diarrhea, nausea and vomiting   Associated symptoms: no chest pain, no cough, no dysuria, no fever, no hematemesis, no hematochezia and no hematuria   Emesis Associated symptoms: abdominal pain and diarrhea   Associated symptoms: no cough and no fever   Diarrhea Associated symptoms: abdominal pain and vomiting   Associated symptoms: no fever        Home Medications Prior to Admission medications   Medication Sig Start Date End Date Taking? Authorizing Provider  cyclobenzaprine  (FLEXERIL ) 10 MG tablet Take 1 tablet (10 mg total) by mouth 2 (two) times daily as needed for muscle spasms. 06/07/23   Reena Canning, NP  mupirocin  cream (BACTROBAN ) 2 % Apply 1 application topically 2 (two) times daily. 11/06/21   Muthersbaugh, Alisa App, PA-C  predniSONE  (DELTASONE ) 20 MG tablet Take 2 tablets (40 mg total) by mouth daily. 06/07/23   Reena Canning, NP  triamcinolone  cream (KENALOG ) 0.1 %  Apply 1 application topically 2 (two) times daily. 07/30/21   Farris Hong, PA-C      Allergies    Patient has no known allergies.    Review of Systems   Review of Systems  Constitutional:  Negative for fever.  Respiratory:  Negative for cough.   Cardiovascular:  Negative for chest pain.  Gastrointestinal:  Positive for abdominal pain, diarrhea, nausea and vomiting. Negative for hematemesis and hematochezia.  Genitourinary:  Negative for dysuria and hematuria.    Physical Exam Updated Vital Signs BP (!) 142/96 (BP Location: Right Arm)   Pulse 82   Temp 98.7 F (37.1 C) (Oral)   Resp 18   Ht 5\' 11"  (1.803 m)   Wt 129.3 kg   SpO2 96%   BMI 39.75 kg/m  Physical Exam Vitals and nursing note reviewed.  Constitutional:      General: He is not in acute distress.    Appearance: He is well-developed.  HENT:     Head: Normocephalic and atraumatic.  Eyes:     Conjunctiva/sclera: Conjunctivae normal.  Cardiovascular:     Rate and Rhythm: Normal rate and regular rhythm.     Heart sounds: No murmur heard. Pulmonary:     Effort: Pulmonary effort is normal. No respiratory distress.     Breath sounds: Normal breath sounds.  Abdominal:     Palpations: Abdomen is soft.     Tenderness: There is generalized abdominal tenderness. There is no guarding  or rebound.  Musculoskeletal:        General: No swelling.     Cervical back: Neck supple.  Skin:    General: Skin is warm and dry.     Capillary Refill: Capillary refill takes less than 2 seconds.  Neurological:     General: No focal deficit present.     Mental Status: He is alert.     ED Results / Procedures / Treatments   Labs (all labs ordered are listed, but only abnormal results are displayed) Labs Reviewed  CBC - Abnormal; Notable for the following components:      Result Value   RBC 6.15 (*)    Hemoglobin 17.4 (*)    HCT 56.2 (*)    Platelets 130 (*)    All other components within normal limits  COMPREHENSIVE  METABOLIC PANEL - Abnormal; Notable for the following components:   Glucose, Bld 100 (*)    Creatinine, Ser 1.50 (*)    Calcium  8.8 (*)    All other components within normal limits  LIPASE, BLOOD  URINALYSIS, ROUTINE W REFLEX MICROSCOPIC    EKG None  Radiology No results found.  Procedures Procedures    Medications Ordered in ED Medications  sodium chloride  0.9 % bolus 1,000 mL (has no administration in time range)  ondansetron  (ZOFRAN ) injection 4 mg (has no administration in time range)    ED Course/ Medical Decision Making/ A&P Clinical Course as of 12/20/23 1735  Mon Dec 20, 2023  1321 Patient tolerating p.o. and feeling better.  Will send home with prescriptions for Imodium  and Zofran .  Return instructions discussed [MB]    Clinical Course User Index [MB] Tonya Fredrickson, MD                                 Medical Decision Making Amount and/or Complexity of Data Reviewed Labs: ordered.  Risk Prescription drug management.   This patient complains of dehydration, fatigue, diarrhea, nausea and vomiting; this involves an extensive number of treatment Options and is a complaint that carries with it a high risk of complications and morbidity. The differential includes gastroenteritis, metabolic derangement, dehydration, obstruction  I ordered, reviewed and interpreted labs, which included CBC with normal white count elevated hemoglobin, chemistries with elevated creatinine, LFTs and lipase normal I ordered medication IV fluids and nausea medication and reviewed PMP when indicated. Previous records obtained and reviewed in epic no recent admissions Social determinants considered, no significant barriers Critical Interventions: None  After the interventions stated above, I reevaluated the patient and found patient to be symptomatically improved and tolerating p.o. Admission and further testing considered, no indications for admission.  No prior creatinines to  compare with but if this is an AKI he likely can hydrate and resolved on his own.  Return instructions discussed.         Final Clinical Impression(s) / ED Diagnoses Final diagnoses:  Nausea vomiting and diarrhea  Dehydration    Rx / DC Orders ED Discharge Orders          Ordered    ondansetron  (ZOFRAN ) 4 MG tablet  Every 8 hours PRN        12/20/23 1321    loperamide  (IMODIUM ) 2 MG capsule  4 times daily PRN        12/20/23 1321              Dorian Renfro C, MD 12/20/23 1739

## 2024-07-13 ENCOUNTER — Ambulatory Visit

## 2024-11-22 ENCOUNTER — Ambulatory Visit: Payer: Self-pay
